# Patient Record
Sex: Male | Born: 1970 | Race: White | Hispanic: No | Marital: Married | Smoking: Never smoker
Health system: Southern US, Community
[De-identification: ages and names within clinical notes are randomized; demographics above are authoritative.]

## PROBLEM LIST (undated history)

## (undated) DIAGNOSIS — Z8249 Family history of ischemic heart disease and other diseases of the circulatory system: Secondary | ICD-10-CM

## (undated) DIAGNOSIS — F419 Anxiety disorder, unspecified: Secondary | ICD-10-CM

## (undated) DIAGNOSIS — I10 Essential (primary) hypertension: Secondary | ICD-10-CM

## (undated) DIAGNOSIS — E785 Hyperlipidemia, unspecified: Secondary | ICD-10-CM

## (undated) DIAGNOSIS — N200 Calculus of kidney: Secondary | ICD-10-CM

## (undated) HISTORY — DX: Calculus of kidney: N20.0

## (undated) HISTORY — DX: Family history of ischemic heart disease and other diseases of the circulatory system: Z82.49

## (undated) HISTORY — DX: Hyperlipidemia, unspecified: E78.5

## (undated) HISTORY — PX: KNEE SURGERY: SHX244

## (undated) HISTORY — DX: Essential (primary) hypertension: I10

## (undated) HISTORY — DX: Anxiety disorder, unspecified: F41.9

---

## 2000-12-06 ENCOUNTER — Encounter: Payer: Self-pay | Admitting: Emergency Medicine

## 2000-12-06 ENCOUNTER — Emergency Department (HOSPITAL_COMMUNITY): Admission: EM | Admit: 2000-12-06 | Discharge: 2000-12-06 | Payer: Self-pay | Admitting: Emergency Medicine

## 2000-12-07 ENCOUNTER — Emergency Department (HOSPITAL_COMMUNITY): Admission: EM | Admit: 2000-12-07 | Discharge: 2000-12-08 | Payer: Self-pay | Admitting: Emergency Medicine

## 2000-12-09 ENCOUNTER — Ambulatory Visit (HOSPITAL_COMMUNITY): Admission: RE | Admit: 2000-12-09 | Discharge: 2000-12-09 | Payer: Self-pay | Admitting: Urology

## 2000-12-09 ENCOUNTER — Encounter: Payer: Self-pay | Admitting: Urology

## 2001-12-16 ENCOUNTER — Emergency Department (HOSPITAL_COMMUNITY): Admission: EM | Admit: 2001-12-16 | Discharge: 2001-12-16 | Payer: Self-pay | Admitting: Emergency Medicine

## 2001-12-16 ENCOUNTER — Encounter: Payer: Self-pay | Admitting: Emergency Medicine

## 2001-12-18 ENCOUNTER — Encounter: Payer: Self-pay | Admitting: Urology

## 2001-12-18 ENCOUNTER — Ambulatory Visit (HOSPITAL_BASED_OUTPATIENT_CLINIC_OR_DEPARTMENT_OTHER): Admission: RE | Admit: 2001-12-18 | Discharge: 2001-12-18 | Payer: Self-pay | Admitting: Urology

## 2002-03-28 ENCOUNTER — Emergency Department (HOSPITAL_COMMUNITY): Admission: EM | Admit: 2002-03-28 | Discharge: 2002-03-28 | Payer: Self-pay | Admitting: Emergency Medicine

## 2002-03-28 ENCOUNTER — Encounter: Payer: Self-pay | Admitting: Emergency Medicine

## 2003-01-16 ENCOUNTER — Emergency Department (HOSPITAL_COMMUNITY): Admission: EM | Admit: 2003-01-16 | Discharge: 2003-01-16 | Payer: Self-pay | Admitting: Emergency Medicine

## 2003-01-16 ENCOUNTER — Encounter: Payer: Self-pay | Admitting: Emergency Medicine

## 2003-08-12 ENCOUNTER — Emergency Department (HOSPITAL_COMMUNITY): Admission: EM | Admit: 2003-08-12 | Discharge: 2003-08-12 | Payer: Self-pay | Admitting: Emergency Medicine

## 2004-12-12 ENCOUNTER — Encounter: Admission: RE | Admit: 2004-12-12 | Discharge: 2004-12-12 | Payer: Self-pay | Admitting: Gastroenterology

## 2004-12-27 ENCOUNTER — Encounter: Admission: RE | Admit: 2004-12-27 | Discharge: 2004-12-27 | Payer: Self-pay | Admitting: Gastroenterology

## 2005-01-25 ENCOUNTER — Ambulatory Visit (HOSPITAL_COMMUNITY): Admission: RE | Admit: 2005-01-25 | Discharge: 2005-01-25 | Payer: Self-pay | Admitting: Gastroenterology

## 2005-01-25 ENCOUNTER — Encounter (INDEPENDENT_AMBULATORY_CARE_PROVIDER_SITE_OTHER): Payer: Self-pay | Admitting: *Deleted

## 2009-04-05 HISTORY — PX: DOPPLER ECHOCARDIOGRAPHY: SHX263

## 2009-04-05 HISTORY — PX: TRANSTHORACIC ECHOCARDIOGRAM: SHX275

## 2010-07-24 HISTORY — PX: NM MYOVIEW LTD: HXRAD82

## 2010-11-10 ENCOUNTER — Encounter
Admission: RE | Admit: 2010-11-10 | Discharge: 2010-11-10 | Payer: Self-pay | Source: Home / Self Care | Attending: Gastroenterology | Admitting: Gastroenterology

## 2010-12-04 ENCOUNTER — Other Ambulatory Visit: Payer: Self-pay | Admitting: Gastroenterology

## 2011-03-23 NOTE — Op Note (Signed)
Richard Morton, Richard Morton              ACCOUNT NO.:  [id number]   MEDICAL RECORD NO.:  [id number]          PATIENT TYPE:  AMB   LOCATION:  ENDO                         FACILITY:  [facility]   PHYSICIAN:  Petra Kuba, M.D.    DATE OF BIRTH:  [birth year]   DATE OF PROCEDURE:  01/25/2005  DATE OF DISCHARGE:                                 OPERATIVE REPORT   PROCEDURE:  Colonoscopy.   INDICATIONS FOR PROCEDURE:  Patient with abdominal pain, increasing  constipation, nondiagnostic workup so far. Want to continue workup with a  colonoscopy.   Consent was signed after risks, benefits, methods, and options were  thoroughly discussed in the office.   MEDICINES USED:  Demerol 100, Versed 9.   DESCRIPTION OF PROCEDURE:  Rectal inspection is pertinent for external  hemorrhoids, small. Digital exam was negative. The video pediatric  adjustable colonoscope was inserted and easily advanced around the colon to  the cecum. This required minimal abdominal pressure but no position changes.  No abnormalities were seen on insertion. The scope was inserted a short ways  into the terminal ileum which was normal. Photo documentation was obtained.  The scope was slowly withdrawn. The prep was adequate. There was some liquid  stool that required washing and suctioning. On slow withdrawal back to the  rectum other than a tiny questionable mid descending polyp which was cold  biopsied x2, no abnormalities were seen. Once back in the rectum, anal  rectal pullthrough and retroflexion confirmed some small hemorrhoids. The  scope was straightened and readvanced a short ways up the left side of the  colon, air was suctioned, scope removed. The patient tolerated the procedure  well. There was no obvious or immediate complication.   ENDOSCOPIC DIAGNOSIS:  1.  Internal and external small hemorrhoids.  2.  Questionable tiny descending polyp cold biopsied.  3.  Otherwise within normal limits to the terminal ileum.   PLAN:  Will await pathology but baring this being an adenoma, repeat  screening probably at age 89, otherwise, will continue Zelnorm since it  seems to be helping. Consider MiraLax next and followup p.r.n. or in 2-3  months.      MEM/MEDQ  D:  01/25/2005  T:  01/25/2005  Job:  [id number]   cc:   Dellis Anes. Idell Pickles, M.D.  [address]  [place]  [place] [address]  Fax: [phone]

## 2011-06-01 ENCOUNTER — Emergency Department (HOSPITAL_COMMUNITY): Payer: BC Managed Care – PPO

## 2011-06-01 ENCOUNTER — Emergency Department (HOSPITAL_COMMUNITY)
Admission: EM | Admit: 2011-06-01 | Discharge: 2011-06-01 | Disposition: A | Discharge status: Home / Self Care | Payer: BC Managed Care – PPO | Attending: Emergency Medicine | Admitting: Emergency Medicine

## 2011-06-01 DIAGNOSIS — S0100XA Unspecified open wound of scalp, initial encounter: Secondary | ICD-10-CM | POA: Insufficient documentation

## 2011-06-01 DIAGNOSIS — S0990XA Unspecified injury of head, initial encounter: Secondary | ICD-10-CM | POA: Insufficient documentation

## 2011-06-01 DIAGNOSIS — Z79899 Other long term (current) drug therapy: Secondary | ICD-10-CM | POA: Insufficient documentation

## 2011-06-01 DIAGNOSIS — R110 Nausea: Secondary | ICD-10-CM | POA: Insufficient documentation

## 2011-06-01 DIAGNOSIS — R296 Repeated falls: Secondary | ICD-10-CM | POA: Insufficient documentation

## 2011-06-01 DIAGNOSIS — R51 Headache: Secondary | ICD-10-CM | POA: Insufficient documentation

## 2011-06-01 DIAGNOSIS — E785 Hyperlipidemia, unspecified: Secondary | ICD-10-CM | POA: Insufficient documentation

## 2011-06-01 DIAGNOSIS — Y92009 Unspecified place in unspecified non-institutional (private) residence as the place of occurrence of the external cause: Secondary | ICD-10-CM | POA: Insufficient documentation

## 2011-06-01 DIAGNOSIS — I10 Essential (primary) hypertension: Secondary | ICD-10-CM | POA: Insufficient documentation

## 2011-06-01 DIAGNOSIS — E876 Hypokalemia: Secondary | ICD-10-CM | POA: Insufficient documentation

## 2011-06-01 DIAGNOSIS — M25569 Pain in unspecified knee: Secondary | ICD-10-CM | POA: Insufficient documentation

## 2011-06-01 DIAGNOSIS — R404 Transient alteration of awareness: Secondary | ICD-10-CM | POA: Insufficient documentation

## 2011-06-01 DIAGNOSIS — R55 Syncope and collapse: Secondary | ICD-10-CM | POA: Insufficient documentation

## 2011-06-01 LAB — DIFFERENTIAL
Eosinophils Absolute: 0.2 10*3/uL (ref 0.0–0.7)
Eosinophils Relative: 2 % (ref 0–5)
Lymphs Abs: 2.1 10*3/uL (ref 0.7–4.0)
Monocytes Relative: 8 % (ref 3–12)

## 2011-06-01 LAB — CBC
MCH: 30.9 pg (ref 26.0–34.0)
MCV: 87.3 fL (ref 78.0–100.0)
Platelets: 181 10*3/uL (ref 150–400)
RBC: 4.95 MIL/uL (ref 4.22–5.81)
RDW: 12.3 % (ref 11.5–15.5)

## 2011-06-01 LAB — BASIC METABOLIC PANEL
CO2: 29 mEq/L (ref 19–32)
Calcium: 9.4 mg/dL (ref 8.4–10.5)
Creatinine, Ser: 0.97 mg/dL (ref 0.50–1.35)

## 2012-04-08 ENCOUNTER — Other Ambulatory Visit: Payer: Self-pay | Admitting: Physician Assistant

## 2012-04-09 NOTE — Telephone Encounter (Signed)
Needs office visit before out 

## 2012-05-12 ENCOUNTER — Other Ambulatory Visit: Payer: Self-pay | Admitting: Physician Assistant

## 2012-05-15 ENCOUNTER — Other Ambulatory Visit: Payer: Self-pay | Admitting: Physician Assistant

## 2012-06-21 ENCOUNTER — Other Ambulatory Visit: Payer: Self-pay | Admitting: Physician Assistant

## 2012-06-21 NOTE — Telephone Encounter (Signed)
When was last OV?

## 2012-06-23 NOTE — Telephone Encounter (Signed)
Never seen here per Epic and Medman.   Deny.Marland KitchenMarland Kitchen

## 2013-07-05 ENCOUNTER — Encounter (HOSPITAL_BASED_OUTPATIENT_CLINIC_OR_DEPARTMENT_OTHER): Payer: Self-pay | Admitting: *Deleted

## 2013-07-05 ENCOUNTER — Emergency Department (HOSPITAL_BASED_OUTPATIENT_CLINIC_OR_DEPARTMENT_OTHER): Payer: Managed Care, Other (non HMO)

## 2013-07-05 ENCOUNTER — Emergency Department (HOSPITAL_BASED_OUTPATIENT_CLINIC_OR_DEPARTMENT_OTHER)
Admission: EM | Admit: 2013-07-05 | Discharge: 2013-07-05 | Disposition: A | Discharge status: Home / Self Care | Payer: Managed Care, Other (non HMO) | Attending: Emergency Medicine | Admitting: Emergency Medicine

## 2013-07-05 DIAGNOSIS — H538 Other visual disturbances: Secondary | ICD-10-CM | POA: Insufficient documentation

## 2013-07-05 DIAGNOSIS — S060X0A Concussion without loss of consciousness, initial encounter: Secondary | ICD-10-CM | POA: Insufficient documentation

## 2013-07-05 DIAGNOSIS — S40812A Abrasion of left upper arm, initial encounter: Secondary | ICD-10-CM

## 2013-07-05 DIAGNOSIS — Z79899 Other long term (current) drug therapy: Secondary | ICD-10-CM | POA: Insufficient documentation

## 2013-07-05 DIAGNOSIS — Y9389 Activity, other specified: Secondary | ICD-10-CM | POA: Insufficient documentation

## 2013-07-05 DIAGNOSIS — IMO0002 Reserved for concepts with insufficient information to code with codable children: Secondary | ICD-10-CM | POA: Insufficient documentation

## 2013-07-05 DIAGNOSIS — Y9289 Other specified places as the place of occurrence of the external cause: Secondary | ICD-10-CM | POA: Insufficient documentation
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
## 2013-07-05 NOTE — ED Notes (Signed)
Karen Sofia, PA at bedside. 
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
## 2013-07-06 NOTE — ED Provider Notes (Signed)
Medical screening examination/treatment/procedure(s) were performed by non-physician practitioner and as supervising physician I was immediately available for consultation/collaboration.   Reneisha Stilley Joseph Blaize Nipper, MD 07/06/13 0658 

## 2013-09-27 ENCOUNTER — Other Ambulatory Visit: Payer: Self-pay | Admitting: Cardiology

## 2013-09-28 NOTE — Telephone Encounter (Signed)
Rx was sent to pharmacy electronically. 

## 2014-04-07 ENCOUNTER — Other Ambulatory Visit: Payer: Self-pay | Admitting: Cardiology

## 2014-04-12 ENCOUNTER — Telehealth: Payer: Self-pay | Admitting: Cardiology

## 2014-04-12 NOTE — Telephone Encounter (Signed)
E-scrbed medication

## 2014-04-12 NOTE — Telephone Encounter (Signed)
Been trying to get generic for CADUET 10/10 mg  Needs to be called to [facility] said they had been trying to get approval for last week.  Please call

## 2014-04-12 NOTE — Telephone Encounter (Signed)
Left message to patient. Medication e- scribed. Also,left message patient need annual appointment.

## 2014-06-18 ENCOUNTER — Encounter: Payer: Self-pay | Admitting: *Deleted

## 2014-06-21 ENCOUNTER — Encounter: Payer: Self-pay | Admitting: Cardiology

## 2014-06-21 ENCOUNTER — Ambulatory Visit (INDEPENDENT_AMBULATORY_CARE_PROVIDER_SITE_OTHER): Payer: Managed Care, Other (non HMO) | Admitting: Cardiology

## 2014-06-21 VITALS — BP 124/74 | HR 74 | Ht 72.0 in | Wt 184.7 lb

## 2014-06-21 DIAGNOSIS — R0789 Other chest pain: Secondary | ICD-10-CM

## 2014-06-21 DIAGNOSIS — I10 Essential (primary) hypertension: Secondary | ICD-10-CM

## 2014-06-21 DIAGNOSIS — Z8249 Family history of ischemic heart disease and other diseases of the circulatory system: Secondary | ICD-10-CM

## 2014-06-21 DIAGNOSIS — E785 Hyperlipidemia, unspecified: Secondary | ICD-10-CM

## 2014-06-21 DIAGNOSIS — R071 Chest pain on breathing: Secondary | ICD-10-CM

## 2014-06-21 NOTE — Patient Instructions (Signed)
No change in medication  Your physician wants you to follow-up in 12 month DR Richard Morton.  You will receive a reminder letter in the mail two months in advance. If you don't receive a letter, please call our office to schedule the follow-up appointment.

## 2014-06-21 NOTE — Progress Notes (Signed)
PATIENT: Richard Morton MRN: [id number] DOB: May 05, 1971 PCP: Windy Carina, PA-C  Clinic Note: Chief Complaint  Patient presents with  . Chest Pain    WENT TO PCP, ACTIVITY AND REST - BILATERAL, COULD REACT PAIN - DEEP BREATHE IN, NO SOB , NO EDEMA    HPI: Richard Morton is a 43 y.o. male with a PMH below who presents today for evaluation of chest discomfort. He is a long-term patient of this practice who I saw last in April 2014. He is a significant family history of CAD and he himself has hypertension and dyslipidemia. He was started on Caduet plus fish oil. He was evaluated for coronary disease with Myoview stress was in 2008 and 2011 that did not show any evidence of ischemia. He is very active with riding his bike and doing gym exercises. He does have significant amount of social stresses with anxiety.  Interval History: He presents today in referral from his PCP after coming in with chest discomfort that really radiated from the center of his chest out along the lower rib cage bilaterally. He does note that he had been doing a lot of biking and yard work with we biking the day before. He noted that the discomfort was worse with deep inspiration and twisting from side to side. Not SOB worse with activity and exertion. It lasted pretty much all day for 2 days with no real relief. Basically had 2 days of symptoms 2 weeks ago, and has not had any further symptoms. He is back to doing his bicycling and was able to ride 20 miles yesterday without any chest discomfort or dyspnea. He does feel sore today however. Besides having his intermittent anxiety attacks for which he takes when necessary Xanax. He really denies any other significant symptoms.  No shortness of breath with rest or exertion. No PND, orthopnea or edema. No palpitations, lightheadedness, dizziness, weakness or syncope/near syncope. No TIA/amaurosis fugax symptoms. No melena, hematochezia, hematuria, or epstaxis. No  claudication.  Past Medical History  Diagnosis Date  . Essential hypertension   . Dyslipidemia, goal LDL below 130   . Family history of premature coronary heart disease     Paternal grandfather and father with MI/CAD in 90s (grandfather)/50s (father)  . Anxiety disorder     Uses when necessary Xanax    Prior Cardiac Evaluation and Past Surgical History: Past Surgical History  Procedure Laterality Date  . Knee surgery      Righ  . Doppler echocardiography  June 2010    normal EF 55% ,WITH NORMAL LV AND FUNCTION,MILDBOWING OF THE ANTERIOR MITRAL LEAFLET ,BUT NO SIGNIFICANT PROLAPSE  . Nm myoview ltd  SEPT 19,2011    WALKED 9 1/2 MINUTES, REACHING 12 METS  WITH SLIGHTLY HYPERTENSIVE RESPONSE. NO ISCHEMIA OR INFARCTION    Allergies  Allergen Reactions  . Hydrocodone   . Hydrocodone-Acetaminophen Rash    Current Outpatient Prescriptions  Medication Sig Dispense Refill  . ALPRAZolam (XANAX) 1 MG tablet take 1 tablet by mouth twice a day if needed      . amlodipine-atorvastatin (CADUET) 10-10 MG per tablet take 1 tablet by mouth at bedtime  30 tablet  3  . finasteride (PROPECIA) 1 MG tablet take 1 tablet by mouth once daily (NEED OFFICE VISIT FOR MORE REFILLS)  30 tablet  0  . indapamide (LOZOL) 2.5 MG tablet Take 2.5 mg by mouth daily.       No current facility-administered medications for this visit.    History  Social History Narrative   Married father of one. Enjoys riding his mountain bike almost 3 days a week. We'll right up to 20 miles at a time. He also does light weights in gym exercises.   Does not smoke tobacco or drink alcohol.    Family History: Heart attack in his father 60(50s) and paternal grandfather 39(48); Heart disease in his father - status post CABG in 2000; Hyperlipidemia in his father and mother.  ROS: A comprehensive Review of Systems - was performed Review of Systems  Constitutional: Negative for fever and chills.  Respiratory: Negative for cough,  hemoptysis, sputum production, shortness of breath and wheezing.   Cardiovascular: Negative.        As noted in history of present illness  Musculoskeletal:       Chest wall soreness along with leg soreness after long bike ride  Psychiatric/Behavioral: Negative for depression, suicidal ideas and substance abuse. The patient is nervous/anxious.   All other systems reviewed and are negative.  PHYSICAL EXAM BP 124/74  Pulse 74  Ht 6' (1.829 m)  Wt 184 lb 11.2 oz (83.779 kg)  BMI 25.04 kg/m2 General appearance: alert, cooperative, appears stated age, no distress and Healthy-appearing HEENT: [place]/AT, EOMI, MMM, anicteric sclera Neck: no adenopathy, no carotid bruit, no JVD, supple, symmetrical, trachea midline and thyroid not enlarged, symmetric, no tenderness/mass/nodules Lungs: clear to auscultation bilaterally, normal percussion bilaterally and Nonlabored, good air movement Heart: regular rate and rhythm, S1, S2 normal, no murmur, click, rub or gallop and normal apical impulse Abdomen: soft, non-tender; bowel sounds normal; no masses,  no organomegaly Extremities: extremities normal, atraumatic, no cyanosis or edema Pulses: 2+ and symmetric Neurologic: Alert and oriented X 3, normal strength and tone. Normal symmetric reflexes. Normal coordination and gait   Adult ECG Report  Rate: 74 ;  Rhythm: normal sinus rhythm; normal axis, intervals, durations and voltage. No ischemic changes noted   Narrative Interpretation: Normal EKG  Recent Labs: None  ASSESSMENT / PLAN: Otherwise relatively stable 43 year old gentleman with symptoms consistent with non-cardiac chest pain  Anterior chest wall pain - noncardiac PA 2 days of chest pain after her lots of physical activity involving the abuse of his arms and shoulders and pectoral muscles. The nature of the symptoms being persistent for 2 days, not made worse with exertion but made worse with inspiration and twisting and turning makes this most  consistent with musculoskeletal chest wall pain that is probably related to muscle strain both from his weed whacking and mountain biking with lots of reverberation from bumpy bike trails.  He is normally good he has had negative Myoview in the past. Symptoms are clearly not anginal in nature. I spent the majority of the visit reassuring him that his symptoms were clearly not anginal based on description. The back and he was able to ride a long bike ride yesterday certainly exclude existing obstructive CAD that would cause resting chest pain 2 weeks ago.  Essential hypertension Excellent control on Caduet  Dyslipidemia, goal LDL below 130 Monitored by PCP. On Caduet which has 40 mg atorvastatin  Family history of premature coronary heart disease He has been evaluated with mild use in the past. All these were negative. I don't think that evaluating his current symptoms with the stress test is necessary especially because he did 18-20 mile bike ride yesterday without any symptoms. Simply based on his risk factors he is being treated aggressively for his hypertension and dyslipidemia. Would not recheck a stress test in  the absence of true cardiac symptoms   Orders Placed This Encounter  Procedures  . EKG 12-Lead   Meds ordered this encounter  Medications  . ALPRAZolam (XANAX) 1 MG tablet    Sig: take 1 tablet by mouth twice a day if needed    Followup: 12 months  Mykala Mccready W, M.D., M.S. Interventional Cardiologist   Pager # [phone] 06/23/2014

## 2014-06-23 ENCOUNTER — Encounter: Payer: Self-pay | Admitting: Cardiology

## 2014-06-23 DIAGNOSIS — Z8249 Family history of ischemic heart disease and other diseases of the circulatory system: Secondary | ICD-10-CM | POA: Insufficient documentation

## 2014-06-23 DIAGNOSIS — R0789 Other chest pain: Secondary | ICD-10-CM | POA: Insufficient documentation

## 2014-06-23 DIAGNOSIS — E785 Hyperlipidemia, unspecified: Secondary | ICD-10-CM | POA: Insufficient documentation

## 2014-06-23 DIAGNOSIS — I10 Essential (primary) hypertension: Secondary | ICD-10-CM | POA: Insufficient documentation

## 2014-06-23 NOTE — Assessment & Plan Note (Addendum)
PA 2 days of chest pain after her lots of physical activity involving the abuse of his arms and shoulders and pectoral muscles. The nature of the symptoms being persistent for 2 days, not made worse with exertion but made worse with inspiration and twisting and turning makes this most consistent with musculoskeletal chest wall pain that is probably related to muscle strain both from his weed whacking and mountain biking with lots of reverberation from bumpy bike trails.  He is normally good he has had negative Myoview in the past. Symptoms are clearly not anginal in nature. I spent the majority of the visit reassuring him that his symptoms were clearly not anginal based on description. The back and he was able to ride a long bike ride yesterday certainly exclude existing obstructive CAD that would cause resting chest pain 2 weeks ago.

## 2014-06-23 NOTE — Assessment & Plan Note (Signed)
Monitored by PCP. On Caduet which has 40 mg atorvastatin

## 2014-06-23 NOTE — Assessment & Plan Note (Signed)
He has been evaluated with mild use in the past. All these were negative. I don't think that evaluating his current symptoms with the stress test is necessary especially because he did 18-20 mile bike ride yesterday without any symptoms. Simply based on his risk factors he is being treated aggressively for his hypertension and dyslipidemia. Would not recheck a stress test in the absence of true cardiac symptoms

## 2014-06-23 NOTE — Assessment & Plan Note (Signed)
Excellent control on Caduet

## 2014-08-12 ENCOUNTER — Other Ambulatory Visit: Payer: Self-pay | Admitting: Cardiology

## 2014-08-12 NOTE — Telephone Encounter (Signed)
Rx was sent to pharmacy electronically. 

## 2014-09-10 ENCOUNTER — Encounter: Payer: Self-pay | Admitting: *Deleted

## 2014-09-10 ENCOUNTER — Other Ambulatory Visit: Payer: Self-pay | Admitting: Nurse Practitioner

## 2014-09-10 ENCOUNTER — Encounter: Payer: Self-pay | Admitting: Nurse Practitioner

## 2014-09-10 ENCOUNTER — Telehealth: Payer: Self-pay | Admitting: Cardiology

## 2014-09-10 ENCOUNTER — Ambulatory Visit (INDEPENDENT_AMBULATORY_CARE_PROVIDER_SITE_OTHER): Payer: Managed Care, Other (non HMO) | Admitting: Nurse Practitioner

## 2014-09-10 ENCOUNTER — Encounter (HOSPITAL_COMMUNITY): Payer: Managed Care, Other (non HMO)

## 2014-09-10 ENCOUNTER — Telehealth: Payer: Self-pay | Admitting: Nurse Practitioner

## 2014-09-10 VITALS — BP 100/70 | HR 61 | Ht 72.0 in | Wt 185.2 lb

## 2014-09-10 DIAGNOSIS — R030 Elevated blood-pressure reading, without diagnosis of hypertension: Secondary | ICD-10-CM

## 2014-09-10 DIAGNOSIS — E785 Hyperlipidemia, unspecified: Secondary | ICD-10-CM

## 2014-09-10 DIAGNOSIS — R0989 Other specified symptoms and signs involving the circulatory and respiratory systems: Secondary | ICD-10-CM

## 2014-09-10 DIAGNOSIS — I10 Essential (primary) hypertension: Secondary | ICD-10-CM

## 2014-09-10 DIAGNOSIS — R071 Chest pain on breathing: Secondary | ICD-10-CM

## 2014-09-10 DIAGNOSIS — IMO0001 Reserved for inherently not codable concepts without codable children: Secondary | ICD-10-CM

## 2014-09-10 DIAGNOSIS — R0789 Other chest pain: Secondary | ICD-10-CM

## 2014-09-10 LAB — BASIC METABOLIC PANEL
BUN: 20 mg/dL (ref 6–23)
CO2: 25 mEq/L (ref 19–32)
Calcium: 9.4 mg/dL (ref 8.4–10.5)
Chloride: 100 mEq/L (ref 96–112)
Creatinine, Ser: 1 mg/dL (ref 0.4–1.5)
GFR: 83.08 mL/min (ref >60.00)
Glucose, Bld: 98 mg/dL (ref 70–99)
Potassium: 3.4 mEq/L — ABNORMAL LOW (ref 3.5–5.1)
Sodium: 137 mEq/L (ref 135–145)

## 2014-09-10 LAB — TSH: TSH: 2.28 u[IU]/mL (ref 0.35–4.50)

## 2014-09-10 MED ORDER — POTASSIUM CHLORIDE ER 10 MEQ PO TBCR: 10.0000 meq | EXTENDED_RELEASE_TABLET | Freq: Every day | ORAL | Status: DC

## 2014-09-10 NOTE — Patient Instructions (Addendum)
We will be checking the following labs today BMET (stat) and TSH  Stay on your current medicines  We will arrange [facility] or here  If you feel an increased heart rate again, try to get an EKG done - can call here or go to ER or Urgent Care  Call the [facility] office at [phone] if you have any questions, problems or concerns.

## 2014-09-10 NOTE — Telephone Encounter (Signed)
Richard Morton is calling today because he has had a episode which include him having sweats and cold chills and chest pains and light headed . Heart rate went up 101 and last night it was in the mid 80's . Would lie to see someone today .Marland Kitchen. Please call    Thanks

## 2014-09-10 NOTE — Progress Notes (Signed)
Richard Morton Date of Birth: [birth year] Medical Record #[id number]  History of Present Illness: Richard Morton is seen back today for a work in visit. Seen for Dr. Herbie BaltimoreHarding. He has HTN and HLD - no known CAD. Negative Myoviews from 2008 and 2011 noted in his record. He is on Propecia for his hair. He uses Lozol for kidney stones.   Last seen by Dr. Herbie BaltimoreHarding in August - felt to be doing ok.  Called earlier today - "Patient states he was out of town on Tuesday at a healthcare convention and around 9pm he had cold and hot sweats, tingly in his legs/hands, felt like heart was racing, and some chest pains (not "crushing" pains). He also reports an upset stomach lasting about 2-3 hours. His symptoms lasted all night. He c/o lightheadedness with movement. He reports similar episodes in the past, but they did not persist 12-13 hours, as this episode did. He reports he takes xanax for anxiety and has had anxiety attacks, but they generally only last about 30 minutes. He reports when he got home, his HR was 101-103, which is very high for him and is now around 70s-80s. He states he tried to recreate his chest pain (trying to r/o if this was MSK pain) and could not recreate it. He has a strong family history of coronary disease and is concerned over his symptoms and the duration of them". Thus added to the flex schedule for today.  Comes in here. He is here alone. He notes that he has basically been doing well. Lots of stress with family, job, parents, etc - life issues. He has chronic anxieties - does not sleep - typically takes xanax at night to sleep. This past [place] [facility] [place] - had dinner- went back to his hotel room around 9pm - abrupt onset of elevated HR, sweating, chills, chest pain - lasted for about 30 minutes but he really did not feel good all night - did not have his Xanax that night or the night prior - then had upset stomach - with diarrhea for 2 to 3 hours. No fevers. Almost went  to the ER - took some Nyquil tabs to sleep. Came home the following day - got his xanax refilled and felt better. He  has not been as active as he has previously - due to stress and work issues and a young child. He is better today but very concerned. He has not had this kind of spell in many months. He says he has had low potassium - told to "eat a banana". Not on any supplement.  Current Outpatient Prescriptions  Medication Sig Dispense Refill  . ALPRAZolam (XANAX) 1 MG tablet take 1 tablet by mouth twice a day if needed    . amlodipine-atorvastatin (CADUET) 10-10 MG per tablet take 1 tablet by mouth at bedtime 30 tablet 9  . finasteride (PROPECIA) 1 MG tablet take 1 tablet by mouth once daily (NEED OFFICE VISIT FOR MORE REFILLS) (Patient taking differently: take 1 tablet by mouth once daily) 30 tablet 0  . indapamide (LOZOL) 2.5 MG tablet Take 2.5 mg by mouth daily.     No current facility-administered medications for this visit.    Allergies  Allergen Reactions  . Hydrocodone   . Hydrocodone-Acetaminophen Rash    Past Medical History  Diagnosis Date  . Essential hypertension   . Dyslipidemia, goal LDL below 130   . Family history of premature coronary heart disease  Paternal grandfather and father with MI/CAD in 6640s (grandfather)/50s (father)  . Anxiety disorder     Uses when necessary Xanax    Past Surgical History  Procedure Laterality Date  . Knee surgery      Righ  . Doppler echocardiography  June 2010    normal EF 55% ,WITH NORMAL LV AND FUNCTION,MILDBOWING OF THE ANTERIOR MITRAL LEAFLET ,BUT NO SIGNIFICANT PROLAPSE  . Nm myoview ltd  SEPT 19,2011    WALKED 9 1/2 MINUTES, REACHING 12 METS  WITH SLIGHTLY HYPERTENSIVE RESPONSE. NO ISCHEMIA OR INFARCTION    History  Smoking status  . Never Smoker   Smokeless tobacco  . Not on file    History  Alcohol Use No    Family History  Problem Relation Age of Onset  . Hyperlipidemia Mother   . Heart disease Father       cabg x3 in 2000  . Hyperlipidemia Father   . Heart attack Father   . Heart attack Paternal Grandfather     In his 3840s    Review of Systems: The review of systems is per the HPI.  All other systems were reviewed and are negative.  Physical Exam: BP 100/70 mmHg  Pulse 61  Ht 6' (1.829 m)  Wt 185 lb 3.2 oz (84.006 kg)  BMI 25.11 kg/m2 Patient is very pleasant and in no acute distress. Skin is warm and dry. Color is normal.  HEENT is unremarkable. Normocephalic/atraumatic. PERRL. Sclera are nonicteric. Neck is supple. No masses. No JVD. Lungs are clear. Cardiac exam shows a regular rate and rhythm. Abdomen is soft. Extremities are without edema. Gait and ROM are intact. No gross neurologic deficits noted.  Wt Readings from Last 3 Encounters:  09/10/14 185 lb 3.2 oz (84.006 kg)  06/21/14 184 lb 11.2 oz (83.779 kg)  07/05/13 175 lb (79.379 kg)    LABORATORY DATA/PROCEDURES:  EKG today shows  Lab Results  Component Value Date   WBC 9.1 06/01/2011   HGB 15.3 06/01/2011   HCT 43.2 06/01/2011   PLT 181 06/01/2011   GLUCOSE 122* 06/01/2011   NA 140 06/01/2011   K 2.8* 06/01/2011   CL 102 06/01/2011   CREATININE 0.97 06/01/2011   BUN 18 06/01/2011   CO2 29 06/01/2011    BNP (last 3 results) No results for input(s): PROBNP in the last 8760 hours.   Assessment / Plan: 1. Multitude of somatic complaints - sounds like a panic attack - plus uses lots of caffeine (loves [place]). Do not think a monitor will help "catch" this since it is so infrequent - advised him if recurs to try and get here/ER/urgent care to check EKG - also suggested the app for his phone to use. Will arrange for GXT. Needs BMET and TSH as well today. Further disposition to follow.   2. HTN -  BP ok  3. HLD - on statin  Patient is agreeable to this plan and will call if any problems develop in the interim.   Rosalio MacadamiaLori C. Adhrit Krenz, RN, ANP-C [facility] [address] [place]  [id number] [phone]

## 2014-09-10 NOTE — Telephone Encounter (Signed)
Patient states he was out of town on Tuesday at a healthcare convention and around 9pm he had cold and hot sweats, tingly in his legs/hands, felt like heart was racing, and some chest pains (not "crushing" pains). He also reports an upset stomach lasting about 2-3 hours. His symptoms lasted all night. He c/o lightheadedness with movement. He reports similar episodes in the past, but they did not persist 12-13 hours, as this episode did. He reports he takes xanax for anxiety and has had anxiety attacks, but they generally only last about 30 minutes. He reports when he got home, his HR was 101-103, which is very high for him and is now around 70s-80s. He states he tried to recreate his chest pain (trying to r/o if this was MSK pain) and could not recreate it. He has a strong family history of coronary disease and is concerned over his symptoms and the duration of them.   Patient is scheduled for appointment TODAY @ 1:30pm

## 2014-09-10 NOTE — Telephone Encounter (Signed)
His BMET was called to him with instructions for starting potassium.  BMET in one week.

## 2014-09-13 ENCOUNTER — Other Ambulatory Visit: Payer: Self-pay | Admitting: *Deleted

## 2014-09-13 DIAGNOSIS — E876 Hypokalemia: Secondary | ICD-10-CM

## 2014-09-17 ENCOUNTER — Other Ambulatory Visit: Payer: Self-pay | Admitting: *Deleted

## 2014-09-17 ENCOUNTER — Other Ambulatory Visit (INDEPENDENT_AMBULATORY_CARE_PROVIDER_SITE_OTHER): Payer: Managed Care, Other (non HMO) | Admitting: *Deleted

## 2014-09-17 DIAGNOSIS — E876 Hypokalemia: Secondary | ICD-10-CM

## 2014-09-17 LAB — BASIC METABOLIC PANEL
BUN: 19 mg/dL (ref 6–23)
CO2: 26 mEq/L (ref 19–32)
Calcium: 9.4 mg/dL (ref 8.4–10.5)
Chloride: 103 mEq/L (ref 96–112)
Creatinine, Ser: 1 mg/dL (ref 0.4–1.5)
GFR: 83.07 mL/min (ref >60.00)
Glucose, Bld: 135 mg/dL — ABNORMAL HIGH (ref 70–99)
Potassium: 3.4 mEq/L — ABNORMAL LOW (ref 3.5–5.1)
Sodium: 136 mEq/L (ref 135–145)

## 2014-09-17 MED ORDER — POTASSIUM CHLORIDE ER 20 MEQ PO TBCR: 20.0000 meq | EXTENDED_RELEASE_TABLET | Freq: Every day | ORAL | Status: DC

## 2014-10-04 ENCOUNTER — Other Ambulatory Visit: Payer: Self-pay

## 2014-10-04 ENCOUNTER — Other Ambulatory Visit (INDEPENDENT_AMBULATORY_CARE_PROVIDER_SITE_OTHER): Payer: Managed Care, Other (non HMO)

## 2014-10-04 DIAGNOSIS — E876 Hypokalemia: Secondary | ICD-10-CM

## 2014-10-04 LAB — BASIC METABOLIC PANEL
BUN: 17 mg/dL (ref 6–23)
CO2: 27 mEq/L (ref 19–32)
Calcium: 9.5 mg/dL (ref 8.4–10.5)
Chloride: 101 mEq/L (ref 96–112)
Creatinine, Ser: 1 mg/dL (ref 0.4–1.5)
GFR: 86.9 mL/min (ref >60.00)
Glucose, Bld: 98 mg/dL (ref 70–99)
Potassium: 3.5 mEq/L (ref 3.5–5.1)
Sodium: 136 mEq/L (ref 135–145)

## 2014-10-04 MED ORDER — POTASSIUM CHLORIDE ER 20 MEQ PO TBCR: 20.0000 meq | EXTENDED_RELEASE_TABLET | Freq: Every day | ORAL | Status: DC

## 2014-10-08 ENCOUNTER — Ambulatory Visit (HOSPITAL_COMMUNITY)
Admission: RE | Admit: 2014-10-08 | Discharge: 2014-10-08 | Disposition: A | Discharge status: Home / Self Care | Payer: Managed Care, Other (non HMO) | Source: Ambulatory Visit | Attending: Internal Medicine | Admitting: Internal Medicine

## 2014-10-08 DIAGNOSIS — R079 Chest pain, unspecified: Secondary | ICD-10-CM | POA: Diagnosis present

## 2014-10-08 DIAGNOSIS — IMO0001 Reserved for inherently not codable concepts without codable children: Secondary | ICD-10-CM

## 2014-10-08 DIAGNOSIS — I10 Essential (primary) hypertension: Secondary | ICD-10-CM

## 2014-10-08 DIAGNOSIS — R071 Chest pain on breathing: Secondary | ICD-10-CM

## 2014-10-08 DIAGNOSIS — R0789 Other chest pain: Secondary | ICD-10-CM

## 2014-10-08 DIAGNOSIS — R0989 Other specified symptoms and signs involving the circulatory and respiratory systems: Secondary | ICD-10-CM

## 2014-10-08 DIAGNOSIS — E785 Hyperlipidemia, unspecified: Secondary | ICD-10-CM

## 2014-10-08 NOTE — Procedures (Signed)
Exercise Treadmill Test   Test  Exercise Tolerance Test Ordering MD: Bryan Lemmaavid Harding, MD  Interpreting MD:   Unique Test No: 1 Treadmill:  1  Indication for ETT: chest pain - rule out ischemia  Contraindication to ETT: No   Stress Modality: exercise - treadmill  Cardiac Imaging Performed: non   Protocol: standard Bruce - maximal  Max BP:  157/70  Max MPHR (bpm):  178 85% MPR (bpm): 151  MPHR obtained (bpm): 181 % MPHR obtained:101  Reached 85% MPHR (min:sec): 7:40 Total Exercise Time (min-sec):  12:00  Workload in METS: 13.40 Borg Scale:   Reason ETT Terminated:  fatigue, right knee pain    ST Segment Analysis At Rest: normal ST segments - no evidence of significant ST depression With Exercise: no evidence of significant ST depression  Other Information Arrhythmia:  occasional pvc's Angina during ETT:  absent (0) Quality of ETT:  diagnostic  ETT Interpretation:  normal - no evidence of ischemia by ST analysis  Comments: Occasional PVC's Excellent exercise tolerance Normal bp response to exercise  Chrystie NoseKenneth C. Hilty, MD, [facility] Attending Cardiologist [facility]

## 2015-02-10 ENCOUNTER — Other Ambulatory Visit: Payer: Self-pay

## 2015-02-10 MED ORDER — POTASSIUM CHLORIDE ER 20 MEQ PO TBCR: 20.0000 meq | EXTENDED_RELEASE_TABLET | Freq: Every day | ORAL | Status: DC

## 2015-04-09 ENCOUNTER — Emergency Department (HOSPITAL_BASED_OUTPATIENT_CLINIC_OR_DEPARTMENT_OTHER)
Admission: EM | Admit: 2015-04-09 | Discharge: 2015-04-09 | Disposition: A | Discharge status: Home / Self Care | Payer: Managed Care, Other (non HMO) | Attending: Emergency Medicine | Admitting: Emergency Medicine

## 2015-04-09 ENCOUNTER — Encounter (HOSPITAL_BASED_OUTPATIENT_CLINIC_OR_DEPARTMENT_OTHER): Payer: Self-pay | Admitting: *Deleted

## 2015-04-09 DIAGNOSIS — Z87442 Personal history of urinary calculi: Secondary | ICD-10-CM | POA: Diagnosis not present

## 2015-04-09 DIAGNOSIS — I10 Essential (primary) hypertension: Secondary | ICD-10-CM | POA: Diagnosis not present

## 2015-04-09 DIAGNOSIS — T63441A Toxic effect of venom of bees, accidental (unintentional), initial encounter: Secondary | ICD-10-CM | POA: Insufficient documentation

## 2015-04-09 DIAGNOSIS — F419 Anxiety disorder, unspecified: Secondary | ICD-10-CM | POA: Diagnosis not present

## 2015-04-09 DIAGNOSIS — E785 Hyperlipidemia, unspecified: Secondary | ICD-10-CM | POA: Diagnosis not present

## 2015-04-09 DIAGNOSIS — Z7952 Long term (current) use of systemic steroids: Secondary | ICD-10-CM | POA: Diagnosis not present

## 2015-04-09 DIAGNOSIS — Z79899 Other long term (current) drug therapy: Secondary | ICD-10-CM | POA: Insufficient documentation

## 2015-04-09 DIAGNOSIS — T63444A Toxic effect of venom of bees, undetermined, initial encounter: Secondary | ICD-10-CM

## 2015-04-09 MED ORDER — PREDNISONE 50 MG PO TABS
60.0000 mg | ORAL_TABLET | Freq: Every day | ORAL | Status: DC
  Administered 2015-04-09: 60 mg via ORAL
  Filled 2015-04-09 (×2): qty 1

## 2015-04-09 MED ORDER — PREDNISONE 50 MG PO TABS: ORAL_TABLET | ORAL | Status: DC

## 2015-04-09 MED ORDER — LORATADINE 10 MG PO TABS: 10.0000 mg | ORAL_TABLET | Freq: Once | ORAL | Status: DC

## 2015-04-09 MED ORDER — FAMOTIDINE 20 MG PO TABS
20.0000 mg | ORAL_TABLET | Freq: Once | ORAL | Status: AC
  Administered 2015-04-09: 20 mg via ORAL
  Filled 2015-04-09: qty 1

## 2015-04-09 MED ORDER — DIPHENHYDRAMINE HCL 25 MG PO CAPS
25.0000 mg | ORAL_CAPSULE | Freq: Once | ORAL | Status: AC
  Administered 2015-04-09: 25 mg via ORAL
  Filled 2015-04-09: qty 1

## 2015-04-09 NOTE — Discharge Instructions (Signed)
1 to 2 tablets of 25 mg Benadryl pills every 4-6 hours as needed to a maximum of 300 mg per day. In addition,  You may take pepcid 20mg   Daily for 5 days and apply a topical hydrocortisone ointment to all affected areas except for the face.   Take acetaminophen (Tylenol) up to 975 mg (this is normally 3 over-the-counter pills) up to 3 times a day. Do not drink alcohol. Make sure your other medications do not contain acetaminophen (Read the labels!)  Do not hesitate to call 911 or return to the emergency room if you develop any shortness of breath, wheezing, tongue or lip swelling.  Please follow with your primary care doctor in the next 2 days for a check-up. They must obtain records for further management.   Do not hesitate to return to the Emergency Department for any new, worsening or concerning symptoms.

## 2015-04-09 NOTE — ED Notes (Signed)
Pt was doing yard work when he was stung 2-3 times.  Pt is unsure if it was a bee or a wasp/yellow jacket.  Pt states that he took tylenol and 25mg  benadryl and he felt almost like something is stuck in his throat.  Pt was advised to come to ER.  No acute distress or sob at this time, no swelling noted in mouth or throat when I looked

## 2015-04-09 NOTE — ED Provider Notes (Signed)
CSN: [id number]     Arrival date & time 04/09/15  1957 History   First MD Initiated Contact with Patient 04/09/15 2105     Chief Complaint  Patient presents with  . Insect Bite     (Consider location/radiation/quality/duration/timing/severity/associated sxs/prior Treatment) HPI  Richard Morton is a 44 y.o. male complaining of being stung 2 times by wasp or yellowjacket several hours ago. Patient took one Benadryl, took a shower and took some acetaminophen. Patient reports a irritation to the back of his throat, he was concerned about possible anaphylactic reaction so he presents to the ED for evaluation. He states that the sensation of irritation has resolved, he denies shortness of breath, wheezing, lip or tong swelling, nausea vomiting dyspepsia. He states his pain is minimal. No prior history of anaphylactic reactions. States his last tetanus shot was within the last 3 years.  Past Medical History  Diagnosis Date  . Essential hypertension   . Dyslipidemia, goal LDL below 130   . Family history of premature coronary heart disease     Paternal grandfather and father with MI/CAD in 50s (grandfather)/50s (father)  . Anxiety disorder     Uses when necessary Xanax  . Kidney stone    Past Surgical History  Procedure Laterality Date  . Knee surgery      Righ  . Doppler echocardiography  June 2010    normal EF 55% ,WITH NORMAL LV AND FUNCTION,MILDBOWING OF THE ANTERIOR MITRAL LEAFLET ,BUT NO SIGNIFICANT PROLAPSE  . Nm myoview ltd  SEPT 19,2011    WALKED 9 1/2 MINUTES, REACHING 12 METS  WITH SLIGHTLY HYPERTENSIVE RESPONSE. NO ISCHEMIA OR INFARCTION   Family History  Problem Relation Age of Onset  . Hyperlipidemia Mother   . Heart disease Father     cabg x3 in 2000  . Hyperlipidemia Father   . Heart attack Father   . Heart attack Paternal Grandfather     In his 48s  . Anemia Neg Hx   . Arrhythmia Neg Hx   . Asthma Neg Hx   . Clotting disorder Neg Hx   . Fainting Neg Hx   . Heart  failure Neg Hx   . Hypertension Neg Hx   . Alzheimer's disease Mother    History  Substance Use Topics  . Smoking status: Never Smoker   . Smokeless tobacco: Not on file  . Alcohol Use: No    Review of Systems  10 systems reviewed and found to be negative, except as noted in the HPI.   Allergies  Hydrocodone and Hydrocodone-acetaminophen  Home Medications   Prior to Admission medications   Medication Sig Start Date End Date Taking? Authorizing Provider  ALPRAZolam Prudy Feeler) 1 MG tablet take 1 tablet by mouth twice a day if needed 05/14/14   Historical Provider, MD  amlodipine-atorvastatin (CADUET) 10-10 MG per tablet take 1 tablet by mouth at bedtime 08/12/14   Marykay Lex, MD  finasteride (PROPECIA) 1 MG tablet take 1 tablet by mouth once daily (NEED OFFICE VISIT FOR MORE REFILLS) Patient taking differently: take 1 tablet by mouth once daily 05/15/12   Raymon Mutton Dunn, PA-C  indapamide (LOZOL) 2.5 MG tablet Take 2.5 mg by mouth daily.    Historical Provider, MD  Potassium Chloride ER 20 MEQ TBCR Take 20 mEq by mouth daily. 02/10/15   Rosalio Macadamia, NP  predniSONE (DELTASONE) 50 MG tablet Take 1 tablet daily with breakfast 04/09/15   Huberta Tompkins, PA-C   BP 128/70 mmHg  Pulse 83  Temp(Src) 98 F (36.7 C) (Oral)  Resp 18  Ht 6' (1.829 m)  Wt 180 lb (81.647 kg)  BMI 24.41 kg/m2  SpO2 100% Physical Exam  Constitutional: He is oriented to person, place, and time. He appears well-developed and well-nourished. No distress.  HENT:  Head: Normocephalic and atraumatic.  Mouth/Throat: Oropharynx is clear and moist.  Eyes: Conjunctivae and EOM are normal. Pupils are equal, round, and reactive to light.  Neck: Normal range of motion.  Cardiovascular: Normal rate, regular rhythm and intact distal pulses.   Pulmonary/Chest: Effort normal and breath sounds normal.  No stridor or drooling. No posterior pharynx edema, lip or tongue swelling. Pt reclining comfortably, speaking in complete  sentences.   No Wheezing, excellent air movement in all fields.     Abdominal: Soft. There is no tenderness.  Musculoskeletal: Normal range of motion.  Neurological: He is alert and oriented to person, place, and time.  Skin: He is not diaphoretic.  Patient has 2 pinpoint lesions to left lower extremity medial side. Trace surrounding erythema.  Psychiatric: He has a normal mood and affect.  Nursing note and vitals reviewed.   ED Course  Procedures (including critical care time) Labs Review Labs Reviewed - No data to display  Imaging Review No results found.   EKG Interpretation None      MDM   Final diagnoses:  Bee sting, undetermined intent, initial encounter    Filed Vitals:   04/09/15 2002 04/09/15 2205  BP: 138/83 128/70  Pulse: 85 83  Temp: 98 F (36.7 C)   TempSrc: Oral   Resp: 20 18  Height: 6' (1.829 m)   Weight: 180 lb (81.647 kg)   SpO2: 100% 100%    Medications  predniSONE (DELTASONE) tablet 60 mg (0 mg Oral Duplicate 04/09/15 2013)  diphenhydrAMINE (BENADRYL) capsule 25 mg (25 mg Oral Given 04/09/15 2011)  famotidine (PEPCID) tablet 20 mg (20 mg Oral Given 04/09/15 2012)    Richard Morton is a pleasant 44 y.o. male presenting with a bee sting and resolve sensation of scratching in the back of his throat. Does not meet criteria for anaphylaxis and epinephrine administration: no multisystem involvement, airway compromise or hypotension. Pt given IVF, decadron, benadryl and pepcid with significant improvement in ED. Discharged with meds for symptom control and referral for allergy testing.   valuation does not show pathology that would require ongoing emergent intervention or inpatient treatment. Pt is hemodynamically stable and mentating appropriately. Discussed findings and plan with patient/guardian, who agrees with care plan. All questions answered. Return precautions discussed and outpatient follow up given.   New Prescriptions   PREDNISONE (DELTASONE)  50 MG TABLET    Take 1 tablet daily with breakfast         Wynetta Emeryicole Saiya Crist, PA-C 04/09/15 2247  Geoffery Lyonsouglas Delo, MD 04/09/15 2251

## 2015-06-10 ENCOUNTER — Other Ambulatory Visit: Payer: Self-pay | Admitting: Cardiology

## 2015-06-10 NOTE — Telephone Encounter (Signed)
Rx(s) sent to pharmacy electronically.  

## 2015-06-17 ENCOUNTER — Other Ambulatory Visit: Payer: Self-pay | Admitting: *Deleted

## 2015-06-17 MED ORDER — POTASSIUM CHLORIDE ER 20 MEQ PO TBCR: 20.0000 meq | EXTENDED_RELEASE_TABLET | Freq: Every day | ORAL | Status: DC

## 2015-06-17 NOTE — Telephone Encounter (Signed)
REFILL 

## 2015-07-01 ENCOUNTER — Emergency Department (HOSPITAL_BASED_OUTPATIENT_CLINIC_OR_DEPARTMENT_OTHER)
Admission: EM | Admit: 2015-07-01 | Discharge: 2015-07-01 | Disposition: A | Discharge status: Home / Self Care | Payer: Managed Care, Other (non HMO) | Attending: Emergency Medicine | Admitting: Emergency Medicine

## 2015-07-01 ENCOUNTER — Encounter (HOSPITAL_BASED_OUTPATIENT_CLINIC_OR_DEPARTMENT_OTHER): Payer: Self-pay

## 2015-07-01 DIAGNOSIS — F419 Anxiety disorder, unspecified: Secondary | ICD-10-CM | POA: Insufficient documentation

## 2015-07-01 DIAGNOSIS — Z87442 Personal history of urinary calculi: Secondary | ICD-10-CM | POA: Insufficient documentation

## 2015-07-01 DIAGNOSIS — I10 Essential (primary) hypertension: Secondary | ICD-10-CM | POA: Diagnosis not present

## 2015-07-01 DIAGNOSIS — E785 Hyperlipidemia, unspecified: Secondary | ICD-10-CM | POA: Diagnosis not present

## 2015-07-01 DIAGNOSIS — Z79899 Other long term (current) drug therapy: Secondary | ICD-10-CM | POA: Insufficient documentation

## 2015-07-01 DIAGNOSIS — R109 Unspecified abdominal pain: Secondary | ICD-10-CM

## 2015-07-01 LAB — CBC WITH DIFFERENTIAL/PLATELET
Basophils Absolute: 0 10*3/uL (ref 0.0–0.1)
Basophils Relative: 1 % (ref 0–1)
EOS ABS: 0.2 10*3/uL (ref 0.0–0.7)
EOS PCT: 4 % (ref 0–5)
HCT: 44.4 % (ref 39.0–52.0)
Hemoglobin: 15.4 g/dL (ref 13.0–17.0)
LYMPHS ABS: 1.8 10*3/uL (ref 0.7–4.0)
LYMPHS PCT: 33 % (ref 12–46)
MCH: 30.4 pg (ref 26.0–34.0)
MCHC: 34.7 g/dL (ref 30.0–36.0)
MCV: 87.6 fL (ref 78.0–100.0)
MONO ABS: 0.6 10*3/uL (ref 0.1–1.0)
MONOS PCT: 11 % (ref 3–12)
Neutro Abs: 2.8 10*3/uL (ref 1.7–7.7)
Neutrophils Relative %: 51 % (ref 43–77)
PLATELETS: 221 10*3/uL (ref 150–400)
RBC: 5.07 MIL/uL (ref 4.22–5.81)
RDW: 12.4 % (ref 11.5–15.5)
WBC: 5.5 10*3/uL (ref 4.0–10.5)

## 2015-07-01 LAB — COMPREHENSIVE METABOLIC PANEL
ALT: 19 U/L (ref 17–63)
ANION GAP: 9 (ref 5–15)
AST: 27 U/L (ref 15–41)
Albumin: 4.3 g/dL (ref 3.5–5.0)
Alkaline Phosphatase: 66 U/L (ref 38–126)
BUN: 18 mg/dL (ref 6–20)
CHLORIDE: 101 mmol/L (ref 101–111)
CO2: 28 mmol/L (ref 22–32)
Calcium: 9.8 mg/dL (ref 8.9–10.3)
Creatinine, Ser: 1.05 mg/dL (ref 0.61–1.24)
Glucose, Bld: 122 mg/dL — ABNORMAL HIGH (ref 65–99)
POTASSIUM: 3.6 mmol/L (ref 3.5–5.1)
SODIUM: 138 mmol/L (ref 135–145)
Total Bilirubin: 1 mg/dL (ref 0.3–1.2)
Total Protein: 7.7 g/dL (ref 6.5–8.1)

## 2015-07-01 LAB — URINALYSIS, ROUTINE W REFLEX MICROSCOPIC
BILIRUBIN URINE: NEGATIVE
Glucose, UA: NEGATIVE mg/dL
HGB URINE DIPSTICK: NEGATIVE
Ketones, ur: NEGATIVE mg/dL
Leukocytes, UA: NEGATIVE
NITRITE: NEGATIVE
PROTEIN: NEGATIVE mg/dL
SPECIFIC GRAVITY, URINE: 1.018 (ref 1.005–1.030)
Urobilinogen, UA: 0.2 mg/dL (ref 0.0–1.0)
pH: 6.5 (ref 5.0–8.0)

## 2015-07-01 NOTE — Discharge Instructions (Signed)
Motrin or Tylenol as needed for pain.  Return to ER with worsening symptoms including fever diarrhea bloody stools or other changes in your condition.   Abdominal Pain Many things can cause abdominal pain. Usually, abdominal pain is not caused by a disease and will improve without treatment. It can often be observed and treated at home. Your health care provider will do a physical exam and possibly order blood tests and X-rays to help determine the seriousness of your pain. However, in many cases, more time must pass before a clear cause of the pain can be found. Before that point, your health care provider may not know if you need more testing or further treatment. HOME CARE INSTRUCTIONS  Monitor your abdominal pain for any changes. The following actions may help to alleviate any discomfort you are experiencing:  Only take over-the-counter or prescription medicines as directed by your health care provider.  Do not take laxatives unless directed to do so by your health care provider.  Try a clear liquid diet (broth, tea, or water) as directed by your health care provider. Slowly move to a bland diet as tolerated. SEEK MEDICAL CARE IF:  You have unexplained abdominal pain.  You have abdominal pain associated with nausea or diarrhea.  You have pain when you urinate or have a bowel movement.  You experience abdominal pain that wakes you in the night.  You have abdominal pain that is worsened or improved by eating food.  You have abdominal pain that is worsened with eating fatty foods.  You have a fever. SEEK IMMEDIATE MEDICAL CARE IF:   Your pain does not go away within 2 hours.  You keep throwing up (vomiting).  Your pain is felt only in portions of the abdomen, such as the right side or the left lower portion of the abdomen.  You pass bloody or black tarry stools. MAKE SURE YOU:  Understand these instructions.   Will watch your condition.   Will get help right away if you  are not doing well or get worse.  Document Released: 08/01/2005 Document Revised: 10/27/2013 Document Reviewed: 07/01/2013 [facility] Patient Information 2015 [place], [place]. This information is not intended to replace advice given to you by your health care provider. Make sure you discuss any questions you have with your health care provider.

## 2015-07-01 NOTE — ED Notes (Signed)
Abdominal pain that started Monday gradually worsening.  States nausea yesterday and loss of appetite.  Took half of an Oxycodone last PM.  Also, states he picked up a heavy trailer on Monday.

## 2015-07-01 NOTE — ED Provider Notes (Signed)
CSN: [id number]     Arrival date & time 07/01/15  0935 History   First MD Initiated Contact with Patient 07/01/15 [phone]     Chief Complaint  Patient presents with  . Abdominal Pain      HPI  Patient presents evaluation of abdominal pain for the last 4 days. He states his pain seems to be worse a few stretches back or moves around. He hasn't had problems with his abdomen normally. He has a good appetite. He states he did some unusual activity and heavy lifting including a heavy trailer hitch on Monday. He states he is leaving on vacation tomorrow and was "just wanted to make sure there wasn't anything like appendicitis".  Past Medical History  Diagnosis Date  . Essential hypertension   . Dyslipidemia, goal LDL below 130   . Family history of premature coronary heart disease     Paternal grandfather and father with MI/CAD in 21s (grandfather)/50s (father)  . Anxiety disorder     Uses when necessary Xanax  . Kidney stone    Past Surgical History  Procedure Laterality Date  . Knee surgery      Righ  . Doppler echocardiography  June 2010    normal EF 55% ,WITH NORMAL LV AND FUNCTION,MILDBOWING OF THE ANTERIOR MITRAL LEAFLET ,BUT NO SIGNIFICANT PROLAPSE  . Nm myoview ltd  SEPT 19,2011    WALKED 9 1/2 MINUTES, REACHING 12 METS  WITH SLIGHTLY HYPERTENSIVE RESPONSE. NO ISCHEMIA OR INFARCTION   Family History  Problem Relation Age of Onset  . Hyperlipidemia Mother   . Heart disease Father     cabg x3 in 2000  . Hyperlipidemia Father   . Heart attack Father   . Heart attack Paternal Grandfather     In his 53s  . Anemia Neg Hx   . Arrhythmia Neg Hx   . Asthma Neg Hx   . Clotting disorder Neg Hx   . Fainting Neg Hx   . Heart failure Neg Hx   . Hypertension Neg Hx   . Alzheimer's disease Mother    Social History  Substance Use Topics  . Smoking status: Never Smoker   . Smokeless tobacco: None  . Alcohol Use: No    Review of Systems  Constitutional: Negative for fever,  chills, diaphoresis, appetite change and fatigue.  HENT: Negative for mouth sores, sore throat and trouble swallowing.   Eyes: Negative for visual disturbance.  Respiratory: Negative for cough, chest tightness, shortness of breath and wheezing.   Cardiovascular: Negative for chest pain.  Gastrointestinal: Positive for abdominal pain. Negative for nausea, vomiting, diarrhea and abdominal distention.  Endocrine: Negative for polydipsia, polyphagia and polyuria.  Genitourinary: Negative for dysuria, frequency and hematuria.  Musculoskeletal: Negative for gait problem.  Skin: Negative for color change, pallor and rash.  Neurological: Negative for dizziness, syncope, light-headedness and headaches.  Hematological: Does not bruise/bleed easily.  Psychiatric/Behavioral: Negative for behavioral problems and confusion.      Allergies  Hydrocodone and Hydrocodone-acetaminophen  Home Medications   Prior to Admission medications   Medication Sig Start Date End Date Taking? Authorizing Provider  ALPRAZolam Prudy Feeler) 1 MG tablet take 1 tablet by mouth twice a day if needed 05/14/14   Historical Provider, MD  amlodipine-atorvastatin (CADUET) 10-10 MG per tablet Take 1 tablet by mouth at bedtime. PATIENT NEEDS TO CONTACT OFFICE FOR ADDITIONAL REFILLS 06/10/15   Marykay Lex, MD  finasteride (PROPECIA) 1 MG tablet take 1 tablet by mouth once daily (NEED OFFICE  VISIT FOR MORE REFILLS) Patient taking differently: take 1 tablet by mouth once daily 05/15/12   Sondra Barges, PA-C  indapamide (LOZOL) 2.5 MG tablet Take 2.5 mg by mouth daily.    Historical Provider, MD  Potassium Chloride ER 20 MEQ TBCR Take 20 mEq by mouth daily. NEED OV. 06/17/15   Marykay Lex, MD   BP 114/63 mmHg  Pulse 75  Temp(Src) 98.5 F (36.9 C) (Oral)  Resp 16  Ht 6' (1.829 m)  Wt 180 lb (81.647 kg)  BMI 24.41 kg/m2  SpO2 100% Physical Exam  Constitutional: He is oriented to person, place, and time. He appears well-developed  and well-nourished. No distress.  HENT:  Head: Normocephalic.  Eyes: Conjunctivae are normal. Pupils are equal, round, and reactive to light. No scleral icterus.  Neck: Normal range of motion. Neck supple. No thyromegaly present.  Cardiovascular: Normal rate and regular rhythm.  Exam reveals no gallop and no friction rub.   No murmur heard. Pulmonary/Chest: Effort normal and breath sounds normal. No respiratory distress. He has no wheezes. He has no rales.  Abdominal: Soft. Bowel sounds are normal. He exhibits no distension. There is no tenderness. There is no rebound.    Musculoskeletal: Normal range of motion.  Neurological: He is alert and oriented to person, place, and time.  Skin: Skin is warm and dry. No rash noted.  Psychiatric: He has a normal mood and affect. His behavior is normal.    ED Course  Procedures (including critical care time) Labs Review Labs Reviewed  COMPREHENSIVE METABOLIC PANEL - Abnormal; Notable for the following:    Glucose, Bld 122 (*)    All other components within normal limits  CBC WITH DIFFERENTIAL/PLATELET  URINALYSIS, ROUTINE W REFLEX MICROSCOPIC (NOT AT [facility])    Imaging Review No results found. I have personally reviewed and evaluated these images and lab results as part of my medical decision-making.   EKG Interpretation None      MDM   Final diagnoses:  Abdominal wall pain    He has a normal urine. Does not have an elevated white blood cell count or fever. His exam is benign and he's had symptoms for 4 days that are reproducible. Comfortable in discharging him without further studies in the emergency room. Given return precautions.    Rolland Porter, MD 07/01/15 [phone]

## 2015-07-01 NOTE — ED Notes (Signed)
MD at bedside. 

## 2015-07-27 ENCOUNTER — Other Ambulatory Visit: Payer: Self-pay | Admitting: Cardiology

## 2015-07-27 MED ORDER — POTASSIUM CHLORIDE ER 20 MEQ PO TBCR: 20.0000 meq | EXTENDED_RELEASE_TABLET | Freq: Every day | ORAL | Status: DC

## 2015-07-27 NOTE — Telephone Encounter (Signed)
Potassium refilled electronically.  Patient notified and also instructed to make an appt with Dr. Herbie Baltimore.  Patient voiced understanding.

## 2015-07-27 NOTE — Telephone Encounter (Signed)
°  1. Which medications need to be refilled? Potassium Chloride MeQ20  2. Which pharmacy is medication to be sent to? [facility] on [address]   3. Do they need a 30 day or 90 day supply? 30  4. Would they like a call back once the medication has been sent to the pharmacy? Yes

## 2015-08-08 ENCOUNTER — Ambulatory Visit (INDEPENDENT_AMBULATORY_CARE_PROVIDER_SITE_OTHER): Payer: Managed Care, Other (non HMO) | Admitting: Cardiology

## 2015-08-08 ENCOUNTER — Encounter: Payer: Self-pay | Admitting: Cardiology

## 2015-08-08 VITALS — BP 104/74 | HR 61 | Ht 72.0 in | Wt 189.0 lb

## 2015-08-08 DIAGNOSIS — I10 Essential (primary) hypertension: Secondary | ICD-10-CM | POA: Diagnosis not present

## 2015-08-08 DIAGNOSIS — R071 Chest pain on breathing: Secondary | ICD-10-CM | POA: Diagnosis not present

## 2015-08-08 DIAGNOSIS — Z8249 Family history of ischemic heart disease and other diseases of the circulatory system: Secondary | ICD-10-CM

## 2015-08-08 DIAGNOSIS — R002 Palpitations: Secondary | ICD-10-CM | POA: Diagnosis not present

## 2015-08-08 DIAGNOSIS — E785 Hyperlipidemia, unspecified: Secondary | ICD-10-CM

## 2015-08-08 DIAGNOSIS — R0789 Other chest pain: Secondary | ICD-10-CM

## 2015-08-08 NOTE — Progress Notes (Signed)
PCP: Aura Dials, MD  Clinic Note: Chief Complaint  Patient presents with  . Follow-up    ANNUAL.Marland KitchenMarland KitchenNO CHEST PAIN/PRESSURE OR SOB  . Palpitations  . Hypertension    HPI: Richard Morton is a 44 y.o. male with a PMH below who presents today for annual followup for palpitations and hypertension.Marland Kitchen  Richard Morton was last seen in Aug 2015. Nov 2015 = rapid HR = saw Richard Morton  Recent Hospitalizations: [facility] - for GI upset & fatigue --> recovered.  Studies Reviewed: no recent studies  Interval History: Richard Morton presents today doing well without any major complaints. He will every now and then feel a sense of fluttering in his chest or closing in sensation consistent with more of an anxiety spell. No really rapid or irregular heartbeats. He spent last weekend hiking and rock climb mountains. Was out there with his family. He notices is a very relaxing at home he experienced for him minimizing his stress and anxiety. His Xanax dose was changed recently and it was a different formulation. The first 4 or 5 at times he took that he felt the fluttering sensation decreased significantly. After that he has not had any further episodes.  No chest pain or shortness of breath with rest or exertion. No PND, orthopnea or edema. No lightheadedness, weakness or syncope/near syncope. No TIA/amaurosis fugax symptom No claudication.   ROS: A comprehensive was performed. Review of Systems  Constitutional: Negative for malaise/fatigue.  Respiratory: Negative for cough and shortness of breath.   Cardiovascular:       Per history of present illness  Gastrointestinal: Negative for blood in stool and melena.       Recent visit to ER for GI upset and fatigue this past weekend  Genitourinary: Negative for hematuria and flank pain.  Musculoskeletal: Positive for joint pain (Right knee is a bit sore.). Negative for myalgias.  Neurological: Positive for dizziness and headaches. Negative for  weakness.  Psychiatric/Behavioral: Positive for depression. The patient is nervous/anxious.        Health issues with parents lots of health issues - anxiety level high.  All other systems reviewed and are negative.    Past Medical History  Diagnosis Date  . Essential hypertension   . Dyslipidemia, goal LDL below 130   . Family history of premature coronary heart disease     Paternal grandfather and father with MI/CAD in 72s (grandfather)/50s (father)  . Anxiety disorder     Uses when necessary Xanax  . Kidney stone     Past Surgical History  Procedure Laterality Date  . Knee surgery      [facility]  . Doppler echocardiography  June 2010    normal EF 55% ,WITH NORMAL LV AND FUNCTION,MILDBOWING OF THE ANTERIOR MITRAL LEAFLET ,BUT NO SIGNIFICANT PROLAPSE  . Nm myoview ltd  SEPT 19,2011    WALKED 9 1/2 MINUTES, REACHING 12 METS  WITH SLIGHTLY HYPERTENSIVE RESPONSE. NO ISCHEMIA OR INFARCTION   Prior to Admission medications   Medication Sig Start Date End Date Taking? Authorizing Provider  alprazolam Prudy Feeler) 2 MG tablet Take 2 mg by mouth at bedtime as needed for sleep or anxiety.   Yes Historical Provider, MD  amlodipine-atorvastatin (CADUET) 10-10 MG per tablet Take 1 tablet by mouth at bedtime. PATIENT NEEDS TO CONTACT OFFICE FOR ADDITIONAL REFILLS 06/10/15  Yes Marykay Lex, MD  finasteride (PROPECIA) 1 MG tablet Take 1 mg by mouth daily.   Yes Historical Provider, MD  indapamide (LOZOL)  2.5 MG tablet Take 2.5 mg by mouth daily.   Yes Historical Provider, MD  Potassium Chloride ER 20 MEQ TBCR Take 20 mEq by mouth daily.   Yes Historical Provider, MD   Allergies  Allergen Reactions  . Hydrocodone-Acetaminophen Nausea Only and Rash    Social History   Social History  . Marital Status: Married    Spouse Name: N/A  . Number of Children: N/A  . Years of Education: N/A   Social History Main Topics  . Smoking status: Never Smoker   . Smokeless tobacco: Never Used  . Alcohol  Use: No  . Drug Use: None  . Sexual Activity: Yes   Other Topics Concern  . None   Social History Narrative   Married father of one. Enjoys riding his mountain bike almost 3 days a week. We'll right up to 20 miles at a time. He also does light weights in gym exercises.   Does not smoke tobacco or drink alcohol.   Family History  Problem Relation Age of Onset  . Hyperlipidemia Mother   . Heart disease Father     cabg x3 in 2000  . Hyperlipidemia Father   . Heart attack Father   . Heart attack Paternal Grandfather     In his 20s  . Anemia Neg Hx   . Arrhythmia Neg Hx   . Asthma Neg Hx   . Clotting disorder Neg Hx   . Fainting Neg Hx   . Heart failure Neg Hx   . Hypertension Neg Hx   . Alzheimer's disease Mother      Wt Readings from Last 3 Encounters:  08/08/15 189 lb (85.73 kg)  07/01/15 180 lb (81.647 kg)  04/09/15 180 lb (81.647 kg)    PHYSICAL EXAM BP 104/74 mmHg  Pulse 61  Ht 6' (1.829 m)  Wt 189 lb (85.73 kg)  BMI 25.63 kg/m2 General appearance: alert, cooperative, appears stated age, no distress and Healthy-appearing HEENT: [place]/AT, EOMI, MMM, anicteric sclera Neck: no adenopathy, no carotid bruit, no JVD, supple, symmetrical, trachea midline and thyroid not enlarged, symmetric, no tenderness/mass/nodules Lungs: clear to auscultation bilaterally, normal percussion bilaterally and Nonlabored, good air movement Heart: regular rate and rhythm, S1, S2 normal, no murmur, click, rub or gallop and normal apical impulse Abdomen: soft, non-tender; bowel sounds normal; no masses, no organomegaly Extremities: extremities normal, atraumatic, no cyanosis or edema; has a knee brace on his right knee -- notes that his knee was sore after hiking this weekend Pulses: 2+ and symmetric Neurologic: Alert and oriented X 3, normal strength and tone. Normal symmetric reflexes. Normal coordination and gait     Adult ECG Report  Rate: 61 ;  Rhythm: normal sinus rhythm and Normal  axis, intervals and durations;   Narrative Interpretation: normal EKG   Other studies Reviewed: Additional studies/ records that were reviewed today include:  Recent Labs:   No results found for: CHOL, HDL, LDLCALC, LDLDIRECT, TRIG, CHOLHDL Lab Results  Component Value Date   K 3.6 07/01/2015    ASSESSMENT / PLAN: Problem List Items Addressed This Visit    Intermittent palpitations (Chronic)    Notably improved. Worsen with anxiety but otherwise relatively stable. Not requiring any medical management.      Relevant Orders   EKG 12-Lead   Family history of premature coronary heart disease (Chronic)   Relevant Orders   EKG 12-Lead   Essential hypertension (Chronic)    Excellent control with current dose of Caduet  Relevant Orders   EKG 12-Lead   Dyslipidemia, goal LDL below 130 (Chronic)    Now down to 10 mg of atorvastatin component of Caduet. Monitored by PCP. No myalgias.      Relevant Orders   EKG 12-Lead   Anterior chest wall pain - noncardiac - Primary    No further having these episodes. He pretty much knows now he has a sensation in his chest done these episodes are associated with anxiety and almost a panic attack. His had negative stress test and is very physically active with no anginal symptoms. The atypical symptoms and he notes are very much not anginal in nature.         Current medicines are reviewed at length with the patient today. (+/- concerns) none The following changes have been made: none Studies Ordered:   Orders Placed This Encounter  Procedures  . EKG 12-Lead   ROV - 1 yr.   Marykay Lex, M.D., M.S. Interventional Cardiologist   Pager # [phone]

## 2015-08-08 NOTE — Patient Instructions (Signed)
No change in current medications   Your physician wants you to follow-up in 12 months with DR HARDING. You will receive a reminder letter in the mail two months in advance. If you don't receive a letter, please call our office to schedule the follow-up appointment.

## 2015-08-10 ENCOUNTER — Encounter: Payer: Self-pay | Admitting: Cardiology

## 2015-08-10 DIAGNOSIS — R002 Palpitations: Secondary | ICD-10-CM | POA: Insufficient documentation

## 2015-08-10 NOTE — Assessment & Plan Note (Signed)
Notably improved. Worsen with anxiety but otherwise relatively stable. Not requiring any medical management.

## 2015-08-10 NOTE — Assessment & Plan Note (Signed)
No further having these episodes. He pretty much knows now he has a sensation in his chest done these episodes are associated with anxiety and almost a panic attack. His had negative stress test and is very physically active with no anginal symptoms. The atypical symptoms and he notes are very much not anginal in nature.

## 2015-08-10 NOTE — Assessment & Plan Note (Signed)
Now down to 10 mg of atorvastatin component of Caduet. Monitored by PCP. No myalgias.

## 2015-08-10 NOTE — Assessment & Plan Note (Signed)
Excellent control with current dose of Caduet

## 2015-09-07 ENCOUNTER — Other Ambulatory Visit: Payer: Self-pay | Admitting: Cardiology

## 2015-09-08 ENCOUNTER — Other Ambulatory Visit: Payer: Self-pay | Admitting: *Deleted

## 2015-09-09 MED ORDER — POTASSIUM CHLORIDE ER 20 MEQ PO TBCR: 20.0000 meq | EXTENDED_RELEASE_TABLET | Freq: Every day | ORAL | Status: DC

## 2015-09-09 NOTE — Telephone Encounter (Signed)
Patient received original RX from Norma FredricksonLori Gerhardt NP ( review lab  Result message) Medication  E-sent to pharmacy

## 2015-09-27 ENCOUNTER — Other Ambulatory Visit: Payer: Self-pay | Admitting: Cardiology

## 2015-09-27 NOTE — Telephone Encounter (Signed)
°*  STAT* If patient is at the pharmacy, call can be transferred to refill team.   1. Which medications need to be refilled? (please list name of each medication and dose if known) Potassium   2. Which pharmacy/location (including street and city if local pharmacy) is medication to be sent to?[facility] [address]  3. Do they need a 30 day or 90 day supply? 30

## 2016-08-13 ENCOUNTER — Other Ambulatory Visit: Payer: Self-pay | Admitting: Cardiology

## 2016-10-04 ENCOUNTER — Other Ambulatory Visit: Payer: Self-pay | Admitting: Cardiology

## 2016-11-05 ENCOUNTER — Other Ambulatory Visit: Payer: Self-pay | Admitting: Cardiology

## 2016-11-06 NOTE — Telephone Encounter (Signed)
Rx request sent to pharmacy.  

## 2016-12-10 ENCOUNTER — Other Ambulatory Visit: Payer: Self-pay | Admitting: Cardiology

## 2017-01-09 ENCOUNTER — Other Ambulatory Visit: Payer: Self-pay | Admitting: *Deleted

## 2017-01-09 MED ORDER — POTASSIUM CHLORIDE ER 20 MEQ PO TBCR: 1.0000 | EXTENDED_RELEASE_TABLET | Freq: Every day | ORAL | 0 refills | Status: DC

## 2017-02-06 ENCOUNTER — Other Ambulatory Visit: Payer: Self-pay | Admitting: Cardiology

## 2017-02-06 ENCOUNTER — Telehealth: Payer: Self-pay | Admitting: Cardiology

## 2017-02-08 ENCOUNTER — Ambulatory Visit (INDEPENDENT_AMBULATORY_CARE_PROVIDER_SITE_OTHER): Payer: Managed Care, Other (non HMO) | Admitting: Cardiology

## 2017-02-08 ENCOUNTER — Encounter: Payer: Self-pay | Admitting: Cardiology

## 2017-02-08 VITALS — BP 116/77 | HR 66 | Ht 72.0 in | Wt 196.0 lb

## 2017-02-08 DIAGNOSIS — Z8249 Family history of ischemic heart disease and other diseases of the circulatory system: Secondary | ICD-10-CM | POA: Diagnosis not present

## 2017-02-08 DIAGNOSIS — Z8639 Personal history of other endocrine, nutritional and metabolic disease: Secondary | ICD-10-CM | POA: Diagnosis not present

## 2017-02-08 DIAGNOSIS — E785 Hyperlipidemia, unspecified: Secondary | ICD-10-CM

## 2017-02-08 DIAGNOSIS — R002 Palpitations: Secondary | ICD-10-CM | POA: Diagnosis not present

## 2017-02-08 DIAGNOSIS — I10 Essential (primary) hypertension: Secondary | ICD-10-CM

## 2017-02-08 MED ORDER — POTASSIUM CHLORIDE ER 20 MEQ PO TBCR: 1.0000 | EXTENDED_RELEASE_TABLET | Freq: Every day | ORAL | 11 refills | Status: DC

## 2017-02-08 NOTE — Patient Instructions (Signed)
Medication Instructions: Your physician recommends that you continue on your current medications as directed. Please refer to the Current Medication list given to you today.   Follow-Up: Your physician wants you to follow-up in: 1 year with Dr. Herbie Baltimore. You will receive a reminder letter in the mail two months in advance. If you don't receive a letter, please call our office to schedule the follow-up appointment.   Any Other Special Instructions will be listed below:  --Stay hydrated (8-10 glasses of water daily)  --Wear tight socks   If you need a refill on your cardiac medications before your next appointment, please call your pharmacy. (Refilled Potassium today)

## 2017-02-08 NOTE — Progress Notes (Signed)
PCP: Aura Dials, MD  Clinic Note: Chief Complaint  Patient presents with  . Follow-up    med. management  pt states no Sx.    HPI: Richard Morton is a 46 y.o. male with a PMH below who presents today for 18 month follow-up for palpitations and hypertension.Marland Kitchen  Richard Morton was last seen on 08/08/2015. Prior to that he was seen in November 2015 by Norma Fredrickson for rapid heart rate   Recent Hospitalizations: None  Studies Reviewed: None  Interval History: Richard Morton returns today doing quite well without any major complaints. He is occasionally had some mild episodes of dizziness, but nothing significant. No really issues with his palpitations as far as rapid heartbeats. This has occasional skipped beats but nothing significant. Blood pressure is doing quite well. He says sometimes it may have some swelling in his feet at the end of the day, but not significant. Usually he notes that his been on in the car for long time or on his feet for long time.  Cardiac Review of Symptoms: No chest pain or shortness of breath with rest or exertion.  No PND, orthopnea with minimal end of day edema.  No weakness or syncope/near syncope,  Or TIA/amaurosis fugax symptoms. No claudication.  ROS: A comprehensive was performed. Review of Systems  HENT: Negative for congestion and nosebleeds.   Respiratory: Negative for shortness of breath.   Gastrointestinal: Negative for blood in stool and melena.  Genitourinary: Negative for hematuria.  Musculoskeletal: Negative for joint pain.  Neurological: Positive for dizziness.  Psychiatric/Behavioral: Negative for depression. The patient is not nervous/anxious.   All other systems reviewed and are negative.   Past Medical History:  Diagnosis Date  . Anxiety disorder    Uses when necessary Xanax  . Dyslipidemia, goal LDL below 130   . Essential hypertension   . Family history of premature coronary heart disease    Paternal grandfather and father  with MI/CAD in 75s (grandfather)/50s (father)  . Kidney stone     Past Surgical History:  Procedure Laterality Date  . DOPPLER ECHOCARDIOGRAPHY  June 2010   normal EF 55% ,WITH NORMAL LV AND FUNCTION,MILDBOWING OF THE ANTERIOR MITRAL LEAFLET ,BUT NO SIGNIFICANT PROLAPSE  . KNEE SURGERY     [facility]  . NM MYOVIEW LTD  SEPT 19,2011   WALKED 9 1/2 MINUTES, REACHING 12 METS  WITH SLIGHTLY HYPERTENSIVE RESPONSE. NO ISCHEMIA OR INFARCTION    Current Meds  Medication Sig  . alprazolam (XANAX) 2 MG tablet Take 2 mg by mouth at bedtime as needed for sleep or anxiety.  Marland Kitchen amlodipine-atorvastatin (CADUET) 10-10 MG tablet take 1 tablet by mouth at bedtime  . finasteride (PROPECIA) 1 MG tablet Take 1 mg by mouth daily.  . indapamide (LOZOL) 2.5 MG tablet Take 2.5 mg by mouth daily.  . Potassium Chloride ER 20 MEQ TBCR Take 1 tablet by mouth daily.  . [DISCONTINUED] Potassium Chloride ER 20 MEQ TBCR Take 1 tablet by mouth daily.    Allergies  Allergen Reactions  . Hydrocodone-Acetaminophen Nausea Only and Rash    Social History   Social History  . Marital status: Married    Spouse name: N/A  . Number of children: N/A  . Years of education: N/A   Social History Main Topics  . Smoking status: Never Smoker  . Smokeless tobacco: Never Used  . Alcohol use No  . Drug use: Unknown  . Sexual activity: Yes   Other Topics Concern  . None  Social History Narrative   Married father of one. Enjoys riding his mountain bike almost 3 days a week. We'll right up to 20 miles at a time. He also does light weights in gym exercises.   Does not smoke tobacco or drink alcohol.    family history includes Alzheimer's disease in his mother; Heart attack in his father and paternal grandfather; Heart disease in his father; Hyperlipidemia in his father and mother.  Wt Readings from Last 3 Encounters:  02/08/17 196 lb (88.9 kg)  08/08/15 189 lb (85.7 kg)  07/01/15 180 lb (81.6 kg)    PHYSICAL EXAM BP  116/77 (BP Location: Left Arm, Patient Position: Sitting, Cuff Size: Normal)   Pulse 66   Ht 6' (1.829 m)   Wt 196 lb (88.9 kg)   BMI 26.58 kg/m  General appearance: Alert and oriented X 3, cooperative, appears stated age, no distress and Healthy-appearing Neck: no adenopathy, no carotid bruit, no JVD, supple,  Lungs: clear to auscultation bilaterally, normal percussion bilaterally and Nonlabored, good air movement Heart: regular rate and rhythm, S1 and S2 normal, no murmur, click, rub or gallop and normal apical impulse Abdomen: soft, non-tender; bowel sounds normal; no masses, no organomegaly Extremities: extremities normal, atraumatic, no cyanosis or edema; has a knee brace on his right knee -- notes that his knee was sore after hiking this weekend Pulses: 2+ and symmetric   Adult ECG Report n/a  Other studies Reviewed: Additional studies/ records that were reviewed today include:  Recent Labs:  No recent labs available     ASSESSMENT / PLAN: Problem List Items Addressed This Visit    Dyslipidemia, goal LDL below 130 (Chronic)    He is on the atorvastatin component of Caduet. Monitored by PCP.      Essential hypertension (Chronic)    Well-controlled on current medications.      Family history of premature coronary heart disease (Chronic)   H/O hypokalemia (Chronic)    He has been on potassium for a while now. We are simply refilling his daily tablet. Labs are followed by PCP.      Intermittent palpitations - Primary (Chronic)    Notably improved. Not currently treating medically.         Current medicines are reviewed at length with the patient today. (+/- concerns) n/a The following changes have been made: n/a  Patient Instructions  Medication Instructions: Your physician recommends that you continue on your current medications as directed. Please refer to the Current Medication list given to you today.   Follow-Up: Your physician wants you to follow-up  in: 1 year with Dr. Herbie Baltimore. You will receive a reminder letter in the mail two months in advance. If you don't receive a letter, please call our office to schedule the follow-up appointment.   Any Other Special Instructions will be listed below:  --Stay hydrated (8-10 glasses of water daily)  --Wear tight socks   If you need a refill on your cardiac medications before your next appointment, please call your pharmacy. (Refilled Potassium today)    Studies Ordered:   No orders of the defined types were placed in this encounter.     Bryan Lemma, M.D., M.S. Interventional Cardiologist   Pager # [phone] Phone # [phone] [address] [place], [place] [address]

## 2017-02-10 ENCOUNTER — Encounter: Payer: Self-pay | Admitting: Cardiology

## 2017-02-10 DIAGNOSIS — Z8639 Personal history of other endocrine, nutritional and metabolic disease: Secondary | ICD-10-CM | POA: Insufficient documentation

## 2017-02-10 NOTE — Assessment & Plan Note (Signed)
Well-controlled on current medications 

## 2017-02-10 NOTE — Assessment & Plan Note (Signed)
He has been on potassium for a while now. We are simply refilling his daily tablet. Labs are followed by PCP.

## 2017-02-10 NOTE — Assessment & Plan Note (Signed)
He is on the atorvastatin component of Caduet. Monitored by PCP.

## 2017-02-10 NOTE — Assessment & Plan Note (Signed)
Notably improved. Not currently treating medically.

## 2017-04-25 NOTE — Telephone Encounter (Signed)
close

## 2017-06-07 ENCOUNTER — Emergency Department (HOSPITAL_BASED_OUTPATIENT_CLINIC_OR_DEPARTMENT_OTHER)
Admission: EM | Admit: 2017-06-07 | Discharge: 2017-06-07 | Disposition: A | Discharge status: Home / Self Care | Payer: 59 | Attending: Emergency Medicine | Admitting: Emergency Medicine

## 2017-06-07 ENCOUNTER — Emergency Department (HOSPITAL_BASED_OUTPATIENT_CLINIC_OR_DEPARTMENT_OTHER): Payer: 59

## 2017-06-07 ENCOUNTER — Encounter (HOSPITAL_BASED_OUTPATIENT_CLINIC_OR_DEPARTMENT_OTHER): Payer: Self-pay

## 2017-06-07 DIAGNOSIS — S0990XA Unspecified injury of head, initial encounter: Secondary | ICD-10-CM

## 2017-06-07 DIAGNOSIS — S060X0A Concussion without loss of consciousness, initial encounter: Secondary | ICD-10-CM | POA: Insufficient documentation

## 2017-06-07 DIAGNOSIS — Y9289 Other specified places as the place of occurrence of the external cause: Secondary | ICD-10-CM | POA: Insufficient documentation

## 2017-06-07 DIAGNOSIS — W228XXA Striking against or struck by other objects, initial encounter: Secondary | ICD-10-CM | POA: Diagnosis not present

## 2017-06-07 DIAGNOSIS — Z79899 Other long term (current) drug therapy: Secondary | ICD-10-CM | POA: Diagnosis not present

## 2017-06-07 DIAGNOSIS — Y9389 Activity, other specified: Secondary | ICD-10-CM | POA: Insufficient documentation

## 2017-06-07 DIAGNOSIS — Y999 Unspecified external cause status: Secondary | ICD-10-CM | POA: Insufficient documentation

## 2017-06-07 DIAGNOSIS — I10 Essential (primary) hypertension: Secondary | ICD-10-CM | POA: Diagnosis not present

## 2017-06-07 MED ORDER — ACETAMINOPHEN 325 MG PO TABS
650.0000 mg | ORAL_TABLET | Freq: Once | ORAL | Status: AC
  Administered 2017-06-07: 650 mg via ORAL
  Filled 2017-06-07: qty 2
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
## 2017-06-07 NOTE — ED Triage Notes (Signed)
Pt states he turned and hit right temporal area on a beam outside his home 2 days ago- c/o HA, visual changes, photosensitivity-no break in skin noted-no LOC-NAD-steady gait

## 2017-06-07 NOTE — Discharge Instructions (Signed)
Please read attached information regarding her condition. Take Tylenol or ibuprofen as needed for headache. Return to ED for worsening pain, increased headache, additional injury, loss of consciousness, numbness, weakness or trouble walking.

## 2017-06-11 ENCOUNTER — Other Ambulatory Visit: Payer: Self-pay | Admitting: Cardiology

## 2017-06-11 NOTE — Telephone Encounter (Signed)
REFILL 

## 2018-01-04 ENCOUNTER — Other Ambulatory Visit: Payer: Self-pay | Admitting: Cardiology

## 2018-01-06 ENCOUNTER — Other Ambulatory Visit: Payer: Self-pay | Admitting: Cardiology

## 2018-01-06 NOTE — Telephone Encounter (Signed)
REFILL 

## 2018-01-16 ENCOUNTER — Emergency Department (HOSPITAL_BASED_OUTPATIENT_CLINIC_OR_DEPARTMENT_OTHER): Payer: 59

## 2018-01-16 ENCOUNTER — Encounter (HOSPITAL_BASED_OUTPATIENT_CLINIC_OR_DEPARTMENT_OTHER): Payer: Self-pay | Admitting: Emergency Medicine

## 2018-01-16 ENCOUNTER — Emergency Department (HOSPITAL_BASED_OUTPATIENT_CLINIC_OR_DEPARTMENT_OTHER)
Admission: EM | Admit: 2018-01-16 | Discharge: 2018-01-16 | Disposition: A | Discharge status: Home / Self Care | Payer: 59 | Attending: Emergency Medicine | Admitting: Emergency Medicine

## 2018-01-16 ENCOUNTER — Other Ambulatory Visit: Payer: Self-pay

## 2018-01-16 DIAGNOSIS — F419 Anxiety disorder, unspecified: Secondary | ICD-10-CM | POA: Insufficient documentation

## 2018-01-16 DIAGNOSIS — R109 Unspecified abdominal pain: Secondary | ICD-10-CM

## 2018-01-16 DIAGNOSIS — I10 Essential (primary) hypertension: Secondary | ICD-10-CM | POA: Insufficient documentation

## 2018-01-16 DIAGNOSIS — R1031 Right lower quadrant pain: Secondary | ICD-10-CM | POA: Insufficient documentation

## 2018-01-16 DIAGNOSIS — Z79899 Other long term (current) drug therapy: Secondary | ICD-10-CM | POA: Insufficient documentation

## 2018-01-16 LAB — CBC
HCT: 47 % (ref 39.0–52.0)
Hemoglobin: 16.4 g/dL (ref 13.0–17.0)
MCH: 31 pg (ref 26.0–34.0)
MCHC: 34.9 g/dL (ref 30.0–36.0)
MCV: 88.8 fL (ref 78.0–100.0)
Platelets: 217 10*3/uL (ref 150–400)
RBC: 5.29 MIL/uL (ref 4.22–5.81)
RDW: 12.1 % (ref 11.5–15.5)
WBC: 5.3 10*3/uL (ref 4.0–10.5)

## 2018-01-16 LAB — URINALYSIS, ROUTINE W REFLEX MICROSCOPIC
Bilirubin Urine: NEGATIVE
GLUCOSE, UA: NEGATIVE mg/dL
Hgb urine dipstick: NEGATIVE
Ketones, ur: NEGATIVE mg/dL
Leukocytes, UA: NEGATIVE
NITRITE: NEGATIVE
Protein, ur: NEGATIVE mg/dL
Specific Gravity, Urine: 1.015 (ref 1.005–1.030)
pH: 8.5 — ABNORMAL HIGH (ref 5.0–8.0)

## 2018-01-16 LAB — COMPREHENSIVE METABOLIC PANEL
ALK PHOS: 79 U/L (ref 38–126)
ALT: 27 U/L (ref 17–63)
ANION GAP: 7 (ref 5–15)
AST: 29 U/L (ref 15–41)
Albumin: 4.6 g/dL (ref 3.5–5.0)
BILIRUBIN TOTAL: 1 mg/dL (ref 0.3–1.2)
BUN: 21 mg/dL — ABNORMAL HIGH (ref 6–20)
CO2: 27 mmol/L (ref 22–32)
CREATININE: 1.1 mg/dL (ref 0.61–1.24)
Calcium: 9.4 mg/dL (ref 8.9–10.3)
Chloride: 101 mmol/L (ref 101–111)
GFR calc non Af Amer: >60 mL/min (ref >60)
Glucose, Bld: 102 mg/dL — ABNORMAL HIGH (ref 65–99)
Potassium: 3.6 mmol/L (ref 3.5–5.1)
SODIUM: 135 mmol/L (ref 135–145)
TOTAL PROTEIN: 8.1 g/dL (ref 6.5–8.1)

## 2018-01-16 LAB — LIPASE, BLOOD: Lipase: 38 U/L (ref 11–51)

## 2018-01-16 MED ORDER — KETOROLAC TROMETHAMINE 30 MG/ML IJ SOLN: 30.0000 mg | Freq: Once | INTRAMUSCULAR | Status: DC

## 2018-01-16 MED ORDER — KETOROLAC TROMETHAMINE 30 MG/ML IJ SOLN
30.0000 mg | Freq: Once | INTRAMUSCULAR | Status: AC
  Administered 2018-01-16: 30 mg via INTRAMUSCULAR
  Filled 2018-01-16: qty 1

## 2018-01-16 NOTE — ED Notes (Signed)
Patient transported to CT 

## 2018-01-16 NOTE — Discharge Instructions (Signed)
Please read attached information regarding your condition. Continue your home medications as previously prescribed. Return to ED for worsening symptoms, severe abdominal pain or chest pain, blood in stool, trouble tolerating p.o. intake.

## 2018-01-16 NOTE — ED Triage Notes (Signed)
LUQ abd pain x 1 weeks, feels bloated. Denies N/V/D

## 2018-01-16 NOTE — ED Provider Notes (Signed)
[facility] EMERGENCY DEPARTMENT Provider Note   CSN: [id number] Arrival date & time: 01/16/18  1426     History   Chief Complaint Chief Complaint  Patient presents with  . Abdominal Pain    HPI Richard Morton is a 47 y.o. male with past medical history of anxiety, hypertension, prior nephrolithiasis, who presents to ED for evaluation of one week history of constant, sharp right sided flank pain radiating to right lower quadrant.  At first he believed it was due to a muscle strain from lifting at the gym.  However, he states that this is not feels like his prior muscle strains.  He has tried simethicone with no relief in his symptoms.  He reports a history of constipation but tells me that his last normal bowel movement was a few hours ago here in the ED.  He denies any nausea or vomiting.  Denies any fevers, chest pain, dysuria, hematuria, hematochezia or melena.  Last kidney stone was approximately 10 years ago and is unsure if this feels similar.  He did need to complete lithotripsy as a result of it.  HPI  Past Medical History:  Diagnosis Date  . Anxiety disorder    Uses when necessary Xanax  . Dyslipidemia, goal LDL below 130   . Essential hypertension   . Family history of premature coronary heart disease    Paternal grandfather and father with MI/CAD in 77s (grandfather)/50s (father)  . Kidney stone     Patient Active Problem List   Diagnosis Date Noted  . H/O hypokalemia 02/10/2017  . Intermittent palpitations 08/10/2015  . Anterior chest wall pain - noncardiac 06/23/2014  . Essential hypertension   . Dyslipidemia, goal LDL below 130   . Family history of premature coronary heart disease     Past Surgical History:  Procedure Laterality Date  . DOPPLER ECHOCARDIOGRAPHY  June 2010   normal EF 55% ,WITH NORMAL LV AND FUNCTION,MILDBOWING OF THE ANTERIOR MITRAL LEAFLET ,BUT NO SIGNIFICANT PROLAPSE  . KNEE SURGERY     [place]igh  . NM MYOVIEW LTD  SEPT 19,2011   WALKED 9 1/2 MINUTES, REACHING 12 METS  WITH SLIGHTLY HYPERTENSIVE RESPONSE. NO ISCHEMIA OR INFARCTION       Home Medications    Prior to Admission medications   Medication Sig Start Date End Date Taking? Authorizing Provider  alprazolam Prudy Feeler) 2 MG tablet Take 2 mg by mouth at bedtime as needed for sleep or anxiety.    [provider]  amlodipine-atorvastatin (CADUET) 10-10 MG tablet TAKE 1 TABLET BY MOUTH AT BEDTIME 06/11/17   Marykay Lex, MD  finasteride (PROPECIA) 1 MG tablet Take 1 mg by mouth daily.    [provider]  indapamide (LOZOL) 2.5 MG tablet Take 2.5 mg by mouth daily.    [provider]  Potassium Chloride ER 20 MEQ TBCR Take 1 tablet by mouth daily. NEED OV. 01/06/18   Marykay Lex, MD    Family History Family History  Problem Relation Age of Onset  . Hyperlipidemia Mother   . Alzheimer's disease Mother   . Heart disease Father        cabg x3 in 2000  . Hyperlipidemia Father   . Heart attack Father   . Heart attack Paternal Grandfather        In his 7s  . Anemia Neg Hx   . Arrhythmia Neg Hx   . Asthma Neg Hx   . Clotting disorder Neg Hx   .  Fainting Neg Hx   . Heart failure Neg Hx   . Hypertension Neg Hx     Social History Social History   Tobacco Use  . Smoking status: Never Smoker  . Smokeless tobacco: Never Used  Substance Use Topics  . Alcohol use: No    Alcohol/week: 0.0 oz  . Drug use: No     Allergies   Hydrocodone-acetaminophen   Review of Systems Review of Systems  Constitutional: Negative for appetite change, chills and fever.  HENT: Negative for ear pain, rhinorrhea, sneezing and sore throat.   Eyes: Negative for photophobia and visual disturbance.  Respiratory: Negative for cough, chest tightness, shortness of breath and wheezing.   Cardiovascular: Negative for chest pain and palpitations.  Gastrointestinal: Positive for abdominal pain. Negative for blood in stool, constipation, diarrhea,  nausea and vomiting.  Genitourinary: Negative for dysuria, hematuria and urgency.  Musculoskeletal: Negative for myalgias.  Skin: Negative for rash.  Neurological: Negative for dizziness, weakness and light-headedness.     Physical Exam Updated Vital Signs BP 109/67 (BP Location: Right Arm)   Pulse 63   Temp 98.3 F (36.8 C) (Oral)   Resp 18   Ht 6' (1.829 m)   Wt 81.6 kg (180 lb)   SpO2 100%   BMI 24.41 kg/m   Physical Exam  Constitutional: He appears well-developed and well-nourished. No distress.  Anxious.  HENT:  Head: Normocephalic and atraumatic.  Nose: Nose normal.  Eyes: Conjunctivae and EOM are normal. Left eye exhibits no discharge. No scleral icterus.  Neck: Normal range of motion. Neck supple.  Cardiovascular: Normal rate, regular rhythm, normal heart sounds and intact distal pulses. Exam reveals no gallop and no friction rub.  No murmur heard. Pulmonary/Chest: Effort normal and breath sounds normal. No respiratory distress.  Abdominal: Soft. Bowel sounds are normal. He exhibits no distension. There is no tenderness. There is no guarding.    Pain reported in the indicated area but no specific tenderness to palpation.  No rebound or guarding present.  CVA tenderness bilaterally.  Musculoskeletal: Normal range of motion. He exhibits no edema.  Neurological: He is alert. He exhibits normal muscle tone. Coordination normal.  Skin: Skin is warm and dry. No rash noted.  Psychiatric: He has a normal mood and affect.  Nursing note and vitals reviewed.    ED Treatments / Results  Labs (all labs ordered are listed, but only abnormal results are displayed) Labs Reviewed  COMPREHENSIVE METABOLIC PANEL - Abnormal; Notable for the following components:      Result Value   Glucose, Bld 102 (*)    BUN 21 (*)    All other components within normal limits  URINALYSIS, ROUTINE W REFLEX MICROSCOPIC - Abnormal; Notable for the following components:   pH 8.5 (*)    All  other components within normal limits  LIPASE, BLOOD  CBC    EKG  EKG Interpretation None       Radiology Ct Renal Stone Study  Result Date: 01/16/2018 CLINICAL DATA:  47 year old male with flank pain radiating to the right lower quadrant. EXAM: CT ABDOMEN AND PELVIS WITHOUT CONTRAST TECHNIQUE: Multidetector CT imaging of the abdomen and pelvis was performed following the standard protocol without IV contrast. COMPARISON:  Abdominal radiograph dated 05/18/2013 and CT dated 11/10/2010 FINDINGS: Evaluation of this exam is limited in the absence of intravenous contrast. Lower chest: The visualized lung bases are clear. No intra-abdominal free air or free fluid. Hepatobiliary: Small calcific focus in the right lobe of  the liver along the diaphragm most likely a granuloma. The liver is otherwise unremarkable. No intrahepatic biliary ductal dilatation. The gallbladder is unremarkable. Pancreas: Unremarkable. No pancreatic ductal dilatation or surrounding inflammatory changes. Spleen: Normal in size without focal abnormality. Adrenals/Urinary Tract: The adrenal glands are unremarkable. Multiple punctate nonobstructing left renal calculi may be present. There is no hydronephrosis. The right kidney is unremarkable. The visualized ureters and urinary bladder are unremarkable. Stomach/Bowel: There is no bowel obstruction or active inflammation. The appendix is normal. Vascular/Lymphatic: The abdominal aorta and IVC are grossly unremarkable on this noncontrast CT. No portal venous gas. There is no adenopathy. Reproductive: The prostate and seminal vesicles are grossly unremarkable. No pelvic mass. Other: Small fat containing umbilical hernia. Musculoskeletal: A 2 cm mixed density lesion in the left iliac bone adjacent to the SI joint is not well characterized but appears nonaggressive and may represent a hemangioma. This is stable compared to the study of 2012. No acute osseous pathology. IMPRESSION: 1. No  hydronephrosis or obstructing stone. 2. No bowel obstruction or active inflammation.  Normal appendix. Electronically Signed   By: Elgie CollardArash  Radparvar M.D.   On: 01/16/2018 18:39    Procedures Procedures (including critical care time)  Medications Ordered in ED Medications  ketorolac (TORADOL) 30 MG/ML injection 30 mg (30 mg Intramuscular Given 01/16/18 1906)     Initial Impression / Assessment and Plan / ED Course  I have reviewed the triage vital signs and the nursing notes.  Pertinent labs & imaging results that were available during my care of the patient were reviewed by me and considered in my medical decision making (see chart for details).     Patient presents to ED for evaluation of one week history of constant, sharp right-sided flank pain radiating to right lower quadrant.  He has had a history of kidney stones but is unable to tell me if this is similar because his last one was a decade ago.  At first he was unsure if this was a muscle strain from lifting at the gym.  No relief with simethicone.  Denies any diarrhea, constipation, nausea, vomiting, hematemesis, hematochezia or melena.  Denies any dysuria or hematuria.  Patient does appear anxious on physical exam.  Pain is reported in the indicated area above but no tenderness to palpation and no rebound, guarding present.  He is afebrile with no use of antipyretics.  He is overall well-appearing does not appear dehydrated.  Work including CBC, BMP, lipase, urinalysis unremarkable.  CT showed no evidence of appendicitis, nephrolithiasis or other acute, life-threatening or surgical cause.  Symptoms could very well be due to a muscle strain is on the fact that his imaging and lab work is reassuring.  Will treat with Toradol, advised him to follow-up with PCP for further evaluation.  Patient appeared anxious at first but appeared reassured after negative workup was told. Patient stable for discharge at this time.  Strict return precautions  given.  Portions of this note were generated with Scientist, clinical (histocompatibility and immunogenetics)Dragon dictation software. Dictation errors may occur despite best attempts at proofreading.   Final Clinical Impressions(s) / ED Diagnoses   Final diagnoses:  Abdominal wall pain    ED Discharge Orders    None       Dietrich PatesKhatri, Damar Petit, PA-C 01/16/18 1914    Tegeler, Canary Brimhristopher J, MD 01/17/18 [phone]

## 2018-05-05 ENCOUNTER — Encounter: Payer: Self-pay | Admitting: Cardiology

## 2018-05-05 ENCOUNTER — Ambulatory Visit (INDEPENDENT_AMBULATORY_CARE_PROVIDER_SITE_OTHER): Payer: 59 | Admitting: Cardiology

## 2018-05-05 VITALS — BP 118/77 | HR 61 | Ht 73.0 in | Wt 187.2 lb

## 2018-05-05 DIAGNOSIS — I10 Essential (primary) hypertension: Secondary | ICD-10-CM

## 2018-05-05 DIAGNOSIS — R002 Palpitations: Secondary | ICD-10-CM

## 2018-05-05 DIAGNOSIS — Z8249 Family history of ischemic heart disease and other diseases of the circulatory system: Secondary | ICD-10-CM

## 2018-05-05 NOTE — Progress Notes (Signed)
PCP: Tracey Harries, MD  Clinic Note: Chief Complaint  Patient presents with  . Follow-up    No complaints  . Palpitations    Stable    HPI: Richard Morton is a 47 y.o. male with a PMH below who presents today for 14 follow-up for palpitations and hypertension.   Sachin Ferencz was last seen in April 2018.  Was doing relatively well at that point.  Palpitations notably improved and blood pressure well controlled.  Recent Hospitalizations: None  Studies Reviewed: None  Interval History: Richard Morton returns today pretty much feeling well.  No major issues.  He said one day he had some increased heartbeats and palpitations, but that did break with vagal maneuvers.  For the most part otherwise has been doing much better.  His anxiety level is also decreased a lot.  He may still notes some skipped heartbeats but no longer having the prolonged rapid palpitations. His blood pressures been well controlled.  His edema is pretty stable and not bothering him. No PND, orthopnea.  No chest tightness or pressure with rest or exertion.  No syncope/near syncope or TIA/amaurosis fugax symptoms.  No claudication.  ROS: A comprehensive was performed. Review of Systems  HENT: Negative for congestion and nosebleeds.   Respiratory: Negative for shortness of breath.   Gastrointestinal: Negative for blood in stool and melena.  Genitourinary: Negative for hematuria.  Musculoskeletal: Negative for joint pain.  Neurological: Positive for dizziness (Usually if he gets up too fast).  Psychiatric/Behavioral: Negative for depression. The patient is not nervous/anxious.   All other systems reviewed and are negative.   Past Medical History:  Diagnosis Date  . Anxiety disorder    Uses when necessary Xanax  . Dyslipidemia, goal LDL below 130   . Essential hypertension   . Family history of premature coronary heart disease    Paternal grandfather and father with MI/CAD in 53s (grandfather)/50s (father)  .  Kidney stone     Past Surgical History:  Procedure Laterality Date  . DOPPLER ECHOCARDIOGRAPHY  June 2010   normal EF 55% ,WITH NORMAL LV AND FUNCTION,MILDBOWING OF THE ANTERIOR MITRAL LEAFLET ,BUT NO SIGNIFICANT PROLAPSE  . KNEE SURGERY     Richard Morton  . NM MYOVIEW LTD  SEPT 19,2011   WALKED 9 1/2 MINUTES, REACHING 12 METS  WITH SLIGHTLY HYPERTENSIVE RESPONSE. NO ISCHEMIA OR INFARCTION    Current Meds  Medication Sig  . alprazolam (XANAX) 2 MG tablet Take 2 mg by mouth at bedtime as needed for sleep or anxiety.  Marland Kitchen amlodipine-atorvastatin (CADUET) 10-10 MG tablet TAKE 1 TABLET BY MOUTH AT BEDTIME  . finasteride (PROPECIA) 1 MG tablet Take 1 mg by mouth daily.  . indapamide (LOZOL) 2.5 MG tablet Take 2.5 mg by mouth daily.  . Potassium Chloride ER 20 MEQ TBCR Take 1 tablet by mouth daily. NEED OV.    Allergies  Allergen Reactions  . Hydrocodone-Acetaminophen Nausea Only and Rash   Social History   Tobacco Use  . Smoking status: Never Smoker  . Smokeless tobacco: Never Used  Substance Use Topics  . Alcohol use: No    Alcohol/week: 0.0 oz  . Drug use: No   Social History   Social History Narrative   Married father of one. Enjoys riding his mountain bike almost 3 days a week. We'll right up to 20 miles at a time. He also does light weights in gym exercises.   Does not smoke tobacco or drink alcohol.    family history includes  Alzheimer's disease in his mother; Heart attack in his father and paternal grandfather; Heart disease in his father; Hyperlipidemia in his father and mother.  Wt Readings from Last 3 Encounters:  05/05/18 187 lb 3.2 oz (84.9 kg)  01/16/18 180 lb (81.6 kg)  06/07/17 182 lb (82.6 kg)    PHYSICAL EXAM BP 118/77   Pulse 61   Ht 6\' 1"  (1.854 m)   Wt 187 lb 3.2 oz (84.9 kg)   BMI 24.70 kg/m   Physical Exam  Constitutional: He is oriented to person, place, and time. He appears well-developed and well-nourished. No distress.  Healthy-appearing.   Well-groomed  HENT:  Head: Normocephalic and atraumatic.  Neck: Normal range of motion. Neck supple. No hepatojugular reflux and no JVD present. Carotid bruit is not present. No thyromegaly present.  Cardiovascular: Normal rate, regular rhythm, intact distal pulses and normal pulses.  No extrasystoles are present. PMI is not displaced. Exam reveals no gallop and no friction rub.  No murmur heard. Pulmonary/Chest: Effort normal and breath sounds normal. No respiratory distress. He has no wheezes. He has no rales.  Abdominal: Soft. Bowel sounds are normal. He exhibits no distension. There is no tenderness.  Musculoskeletal: Normal range of motion. He exhibits no edema.  Lymphadenopathy:    He has no cervical adenopathy.  Neurological: He is alert and oriented to person, place, and time.  Psychiatric: He has a normal mood and affect. His behavior is normal. Judgment and thought content normal.  Vitals reviewed.   Adult ECG Report n/a  Other studies Reviewed: Additional studies/ records that were reviewed today include:  Recent Labs:  No recent labs available besides BUN/ Cr 21/1.1 from March 2018    ASSESSMENT / PLAN: Problem List Items Addressed This Visit    Intermittent palpitations - Primary (Chronic)    Notably improved.  Not currently taking any beta-blocker.      Relevant Orders   EKG 12-Lead (Completed)   Family history of premature coronary heart disease (Chronic)    Evaluated with Myoview in the past.  Negative.  No further symptoms.  Continue with statin and blood pressure control.      Relevant Orders   EKG 12-Lead (Completed)   Essential hypertension (Chronic)    Excellent control with amlodipine part of Caduet         Current medicines are reviewed at length with the patient today. (+/- concerns) n/a The following changes have been made: n/a  Patient Instructions  NO CHANGES WITH MEDICATIONS    Your physician wants you to follow-up in 12 MONTHS WITH DR  HARDING. You will receive a reminder letter in the mail two months in advance. If you don't receive a letter, please call our office to schedule the follow-up appointment.\   If you need a refill on your cardiac medications before your next appointment, please call your pharmacy.    Studies Ordered:   Orders Placed This Encounter  Procedures  . EKG 12-Lead      Bryan Lemmaavid Harding, M.D., M.S. Interventional Cardiologist   Pager # [phone] Phone # [phone] [address] [place], [place] [id number]

## 2018-05-05 NOTE — Patient Instructions (Signed)
NO CHANGES WITH MEDICATIONS     Your physician wants you to follow-up in 12 MONTHS WITH DR HARDING. You will receive a reminder letter in the mail two months in advance. If you don't receive a letter, please call our office to schedule the follow-up appointment.    If you need a refill on your cardiac medications before your next appointment, please call your pharmacy.  

## 2018-05-08 ENCOUNTER — Encounter: Payer: Self-pay | Admitting: Cardiology

## 2018-05-08 NOTE — Assessment & Plan Note (Signed)
Notably improved.  Not currently taking any beta-blocker.

## 2018-05-08 NOTE — Assessment & Plan Note (Signed)
Evaluated with Myoview in the past.  Negative.  No further symptoms.  Continue with statin and blood pressure control.

## 2018-05-08 NOTE — Assessment & Plan Note (Signed)
Excellent control with amlodipine part of Caduet

## 2018-05-09 ENCOUNTER — Other Ambulatory Visit: Payer: Self-pay | Admitting: Cardiology

## 2019-05-08 ENCOUNTER — Other Ambulatory Visit: Payer: Self-pay | Admitting: Cardiology

## 2019-08-09 ENCOUNTER — Other Ambulatory Visit: Payer: Self-pay | Admitting: Cardiology

## 2019-09-10 ENCOUNTER — Ambulatory Visit: Payer: 59 | Admitting: Cardiology

## 2019-09-22 ENCOUNTER — Ambulatory Visit: Payer: 59 | Admitting: Cardiology

## 2019-09-24 ENCOUNTER — Other Ambulatory Visit: Payer: Self-pay

## 2019-09-24 ENCOUNTER — Ambulatory Visit (INDEPENDENT_AMBULATORY_CARE_PROVIDER_SITE_OTHER): Payer: 59 | Admitting: Cardiology

## 2019-09-24 ENCOUNTER — Encounter: Payer: Self-pay | Admitting: Cardiology

## 2019-09-24 VITALS — BP 104/60 | HR 75 | Temp 97.9°F | Ht 72.0 in | Wt 191.0 lb

## 2019-09-24 DIAGNOSIS — R002 Palpitations: Secondary | ICD-10-CM

## 2019-09-24 DIAGNOSIS — I10 Essential (primary) hypertension: Secondary | ICD-10-CM

## 2019-09-24 DIAGNOSIS — Z8249 Family history of ischemic heart disease and other diseases of the circulatory system: Secondary | ICD-10-CM

## 2019-09-24 DIAGNOSIS — E785 Hyperlipidemia, unspecified: Secondary | ICD-10-CM | POA: Diagnosis not present

## 2019-09-24 NOTE — Patient Instructions (Signed)
Medication Instructions:  No changes *If you need a refill on your cardiac medications before your next appointment, please call your pharmacy*  Lab Work: Not needed If you have labs (blood work) drawn today and your tests are completely normal, you will receive your results only by: . MyChart Message (if you have MyChart) OR . A paper copy in the mail If you have any lab test that is abnormal or we need to change your treatment, we will call you to review the results.  Testing/Procedures: Not needed  Follow-Up: At [facility], you and your health needs are our priority.  As part of our continuing mission to provide you with exceptional heart care, we have created designated Provider Care Teams.  These Care Teams include your primary Cardiologist (physician) and Advanced Practice Providers (APPs -  Physician Assistants and Nurse Practitioners) who all work together to provide you with the care you need, when you need it.  Your next appointment:   12 month(s)  The format for your next appointment:   In Person  Provider:   David Harding, MD  Other Instructions  

## 2019-09-24 NOTE — Progress Notes (Signed)
Primary Care Provider: Tracey HarriesBouska, David, MD Cardiologist: No primary care provider on file. Electrophysiologist:   Clinic Note: Chief Complaint  Patient presents with  . Follow-up    12 MONTHS    HPI:    Richard Morton is a 48 y.o. male with a PMH below who presents today for history of palpitations hypertension.  Richard Lairimothy Palazzo was last seen on June 02, 2017.  Was doing well no major issues.  Few breakthrough episodes of increased heart rates that broke with vagal maneuvers.  Otherwise doing well.  Not currently on beta-blocker.  Recent Hospitalizations: None  Reviewed  CV studies:    The following studies were reviewed today: (if available, images/films reviewed: From Epic Chart or Care Everywhere) . None:  Interval History:   Richard Lairimothy Matt returns here today for delayed annual follow-up.  Marcial Pacasimothy says he is doing very well.  No major symptoms.  His blood pressures been well controlled and is not noticing any palpitations.  He is taking Caduet.  Not using any beta-blocker at this point.  CV Review of Symptoms (Summary) Cardiovascular ROS: no chest pain or dyspnea on exertion negative for - edema, irregular heartbeat, orthopnea, palpitations, paroxysmal nocturnal dyspnea, rapid heart rate, shortness of breath or Syncope/near syncope, TIA/amaurosis fugax; claudication  The patient does not have symptoms concerning for COVID-19 infection (fever, chills, cough, or new shortness of breath).  The patient is practicing social distancing. ++ Masking.  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  HENT: Negative for congestion and nosebleeds.   Respiratory: Negative for shortness of breath.   Gastrointestinal: Negative for blood in stool and  melena.  Genitourinary: Negative for hematuria.  Psychiatric/Behavioral: Negative for memory loss. The patient is not nervous/anxious and does not have insomnia.   All other systems reviewed and are negative.   I have reviewed and (if needed) personally updated the patient's problem list, medications, allergies, past medical and surgical history, social and family history.   PAST MEDICAL HISTORY   Past Medical History:  Diagnosis Date  . Anxiety disorder    Uses when necessary Xanax  . Dyslipidemia, goal LDL below 130   . Essential hypertension   . Family history of premature coronary heart disease    Paternal grandfather and father with MI/CAD in 6440s (grandfather)/50s (father)  . Kidney stone     PAST SURGICAL HISTORY   Past Surgical History:  Procedure Laterality Date  . KNEE SURGERY     [place]  . NM MYOVIEW LTD  SEPT 19,2011   WALKED 9 1/2 MINUTES, REACHING 12 METS  WITH SLIGHTLY HYPERTENSIVE RESPONSE. NO ISCHEMIA OR INFARCTION  . TRANSTHORACIC ECHOCARDIOGRAM  June 2010   normal EF 55% ,WITH NORMAL LV AND FUNCTION,MILDBOWING OF THE ANTERIOR MITRAL LEAFLET ,BUT NO SIGNIFICANT PROLAPSE    MEDICATIONS/ALLERGIES   Current Meds  Medication Sig  . alprazolam (XANAX) 2 MG tablet Take 2 mg by mouth at bedtime as needed for sleep or anxiety.  Marland Kitchen. amlodipine-atorvastatin (CADUET) 10-10 MG tablet Take 1 tablet by mouth daily. Office visit needed before additional refills  . finasteride (PROPECIA) 1 MG tablet Take 1 mg by mouth daily.  . indapamide (LOZOL) 2.5 MG tablet Take 2.5 mg by mouth daily.  . Potassium Chloride ER 20 MEQ TBCR Take 1 tablet  by mouth daily. NEED OV.    Allergies  Allergen Reactions  . Hydrocodone-Acetaminophen Nausea Only and Rash    SOCIAL HISTORY/FAMILY HISTORY   Social History   Tobacco Use  . Smoking status: Never Smoker  . Smokeless tobacco: Never Used  Substance Use Topics  . Alcohol use: No    Alcohol/week: 0.0 standard drinks  . Drug use:  No   Social History   Social History Narrative   Married father of one. Enjoys riding his mountain bike almost 3 days a week. We'll right up to 20 miles at a time. He also does light weights in gym exercises.   Does not smoke tobacco or drink alcohol.    Family History family history includes Alzheimer's disease in his mother; Heart attack in his father and paternal grandfather; Heart disease in his father; Hyperlipidemia in his father and mother.   OBJCTIVE -PE, EKG, labs   Wt Readings from Last 3 Encounters:  09/24/19 191 lb (86.6 kg)  05/05/18 187 lb 3.2 oz (84.9 kg)  01/16/18 180 lb (81.6 kg)    Physical Exam: BP 104/60 (BP Location: Left Arm, Patient Position: Sitting, Cuff Size: Normal)   Pulse 75   Temp 97.9 F (36.6 C)   Ht 6' (1.829 m)   Wt 191 lb (86.6 kg)   BMI 25.90 kg/m  Physical Exam  Constitutional: He is oriented to person, place, and time. He appears well-developed and well-nourished. No distress.  Healthy-appearing.  Well-groomed  HENT:  Head: Normocephalic and atraumatic.  Neck: Normal range of motion. Neck supple. No JVD present. Carotid bruit is not present.  Cardiovascular: Normal rate, regular rhythm, normal heart sounds and intact distal pulses. Exam reveals no gallop and no friction rub.  No murmur heard. Pulmonary/Chest: Effort normal and breath sounds normal. No respiratory distress. He has no wheezes. He has no rales.  Musculoskeletal: Normal range of motion.        General: No edema.  Neurological: He is alert and oriented to person, place, and time.  Psychiatric: He has a normal mood and affect. His behavior is normal. Judgment and thought content normal.  Vitals reviewed.    Adult ECG Report  Rate: 75 ;  Rhythm: normal sinus rhythm and Normal axis, intervals and durations;   Narrative Interpretation: Stable EKG  Recent Labs:  n/a  No results found for: CHOL, HDL, LDLCALC, LDLDIRECT, TRIG, CHOLHDL Lab Results  Component Value Date    CREATININE 1.10 01/16/2018   BUN 21 (H) 01/16/2018   NA 135 01/16/2018   K 3.6 01/16/2018   CL 101 01/16/2018   CO2 27 01/16/2018    ASSESSMENT/PLAN    Problem List Items Addressed This Visit    Intermittent palpitations (Chronic)    Doing very well.  Likely related to anxiety.  Asked questions about potentially being on more stable medication besides using as needed Xanax.  -> Recommend that he discuss possible SSRI or SNRI with PCP.      Relevant Orders   EKG 12-Lead   Essential hypertension - Primary (Chronic)    Well-controlled blood pressure on current dose of amlodipine (as part of Caduet)      Relevant Orders   EKG 12-Lead   Dyslipidemia, goal LDL below 130 (Chronic)    On 10 mg atorvastatin as part of Caduet.  Monitored by PCP.      Family history of premature coronary heart disease (Chronic)    No active cardiac symptoms.  Wants to continue to follow-up  with cardiology. Recommendations are to continue adequate blood pressure and cholesterol control. We will consider evaluation with coronary calcium score in the future.          COVID-19 Education: The signs and symptoms of COVID-19 were discussed with the patient and how to seek care for testing (follow up with PCP or arrange E-visit).   The importance of social distancing was discussed today.  I spent a total of 22minutes with the patient and chart review. >  50% of the time was spent in direct patient consultation.  Additional time spent with chart review (studies, outside notes, etc): 3 Total Time: 18 min   Current medicines are reviewed at length with the patient today.  (+/- concerns) n/a   Patient Instructions / Medication Changes & Studies & Tests Ordered   Patient Instructions  Medication Instructions:  No changes *If you need a refill on your cardiac medications before your next appointment, please call your pharmacy*  Lab Work: Not needed If you have labs (blood work) drawn today and  your tests are completely normal, you will receive your results only by: Marland Kitchen MyChart Message (if you have MyChart) OR . A paper copy in the mail If you have any lab test that is abnormal or we need to change your treatment, we will call you to review the results.  Testing/Procedures: Not  needed  Follow-Up: At [facility], you and your health needs are our priority.  As part of our continuing mission to provide you with exceptional heart care, we have created designated Provider Care Teams.  These Care Teams include your primary Cardiologist (physician) and Advanced Practice Providers (APPs -  Physician Assistants and Nurse Practitioners) who all work together to provide you with the care you need, when you need it.  Your next appointment:   12 month(s)  The format for your next appointment:   In Person  Provider:   Glenetta Hew, MD  Other Instructions     Studies Ordered:   Orders Placed This Encounter  Procedures  . EKG 12-Lead     Glenetta Hew, M.D., M.S. Interventional Cardiologist   Pager # [phone] Phone # [phone] [address] [place], [place] [address]   Thank you for choosing Heartcare at [facility]!!

## 2019-09-26 ENCOUNTER — Encounter: Payer: Self-pay | Admitting: Cardiology

## 2019-09-26 NOTE — Assessment & Plan Note (Signed)
Well-controlled blood pressure on current dose of amlodipine (as part of Caduet)

## 2019-09-26 NOTE — Assessment & Plan Note (Addendum)
On 10 mg atorvastatin as part of Caduet.  Monitored by PCP.

## 2019-09-26 NOTE — Assessment & Plan Note (Signed)
No active cardiac symptoms.  Wants to continue to follow-up with cardiology. Recommendations are to continue adequate blood pressure and cholesterol control. We will consider evaluation with coronary calcium score in the future.

## 2019-09-26 NOTE — Assessment & Plan Note (Signed)
Doing very well.  Likely related to anxiety.  Asked questions about potentially being on more stable medication besides using as needed Xanax.  -> Recommend that he discuss possible SSRI or SNRI with PCP.

## 2020-01-07 IMAGING — CT CT RENAL STONE PROTOCOL
2 of 4 series · 16 of 46 positions shown, 18 images · non-contrast
Comparison: Abdominal radiograph dated 05/18/2013 and CT dated
11/10/2010

CLINICAL DATA: 46-year-old male with flank pain radiating to the
right lower quadrant.

EXAM:
CT ABDOMEN AND PELVIS WITHOUT CONTRAST
TECHNIQUE: Multidetector CT imaging of the abdomen and pelvis was performed
following the standard protocol without IV contrast.

[Series 2: axial st · axial · 0.83mm/px · z∈[-679,-214]mm · 13 of 103 slices shown, 15 images]
[im 5/103  soft-tissue]
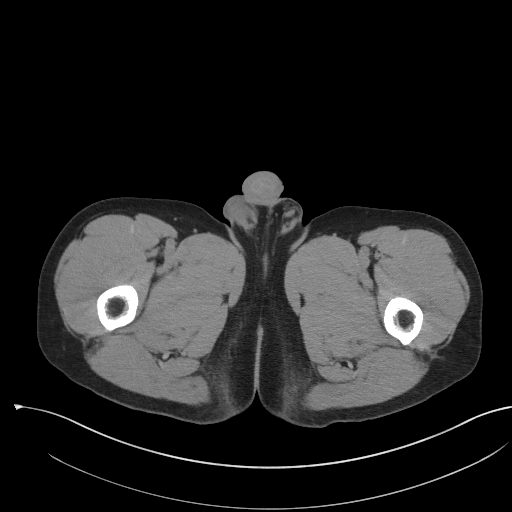
[im 5/103  bone]
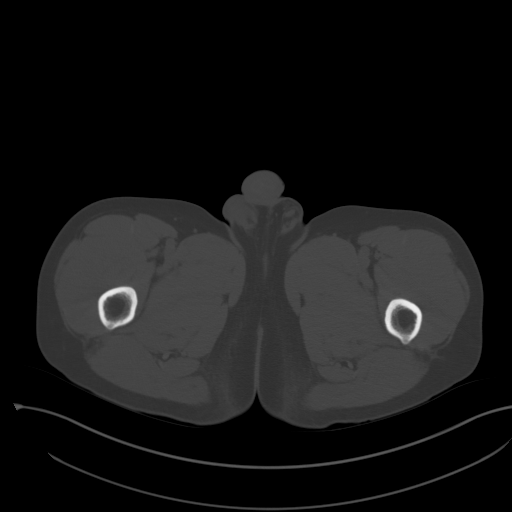
[im 13/103  soft-tissue]
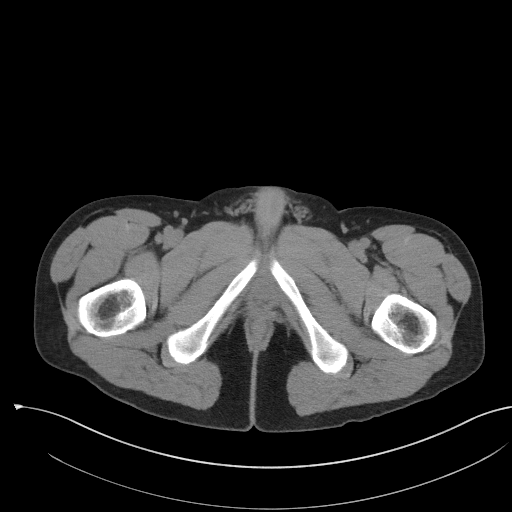
[im 22/103  soft-tissue]
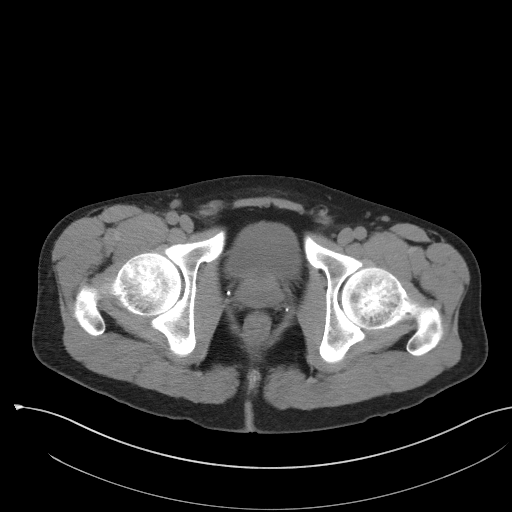
[im 30/103  soft-tissue]
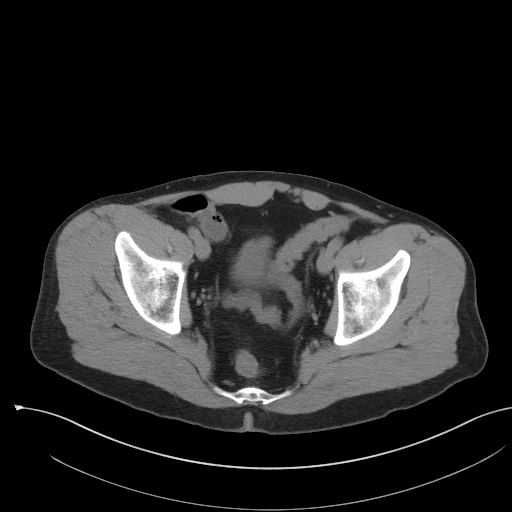
[im 35/103  soft-tissue]
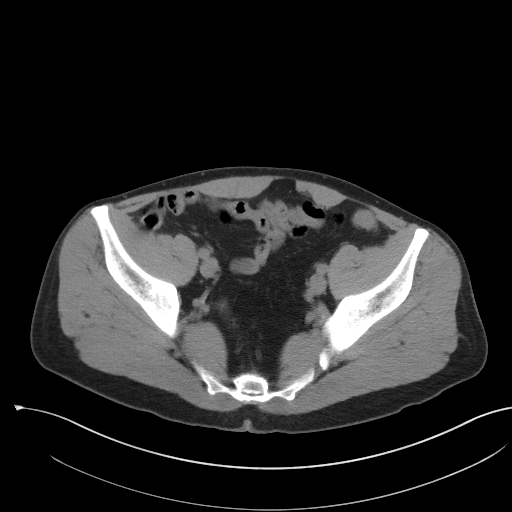
[im 43/103  soft-tissue]
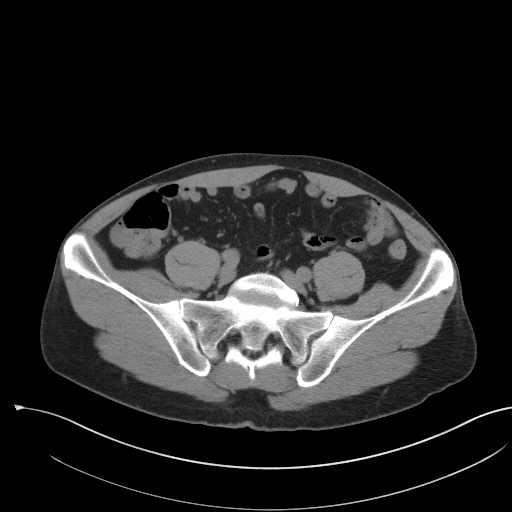
[im 52/103  soft-tissue]
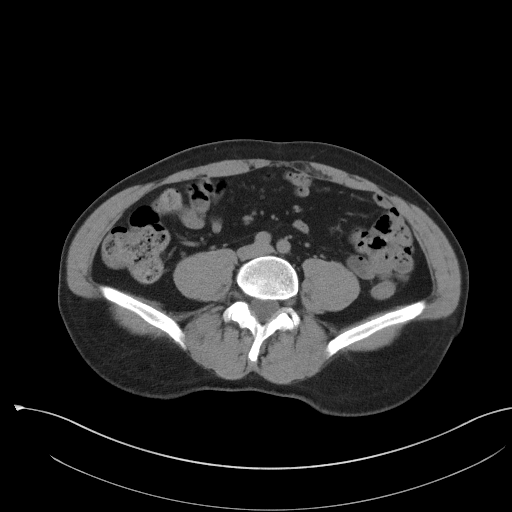
[im 60/103  soft-tissue]
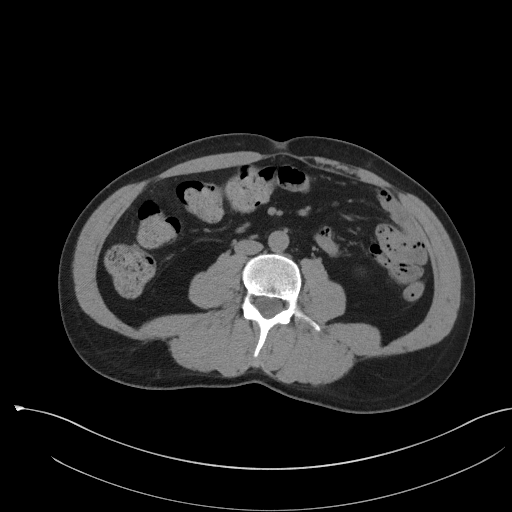
[im 69/103  soft-tissue]
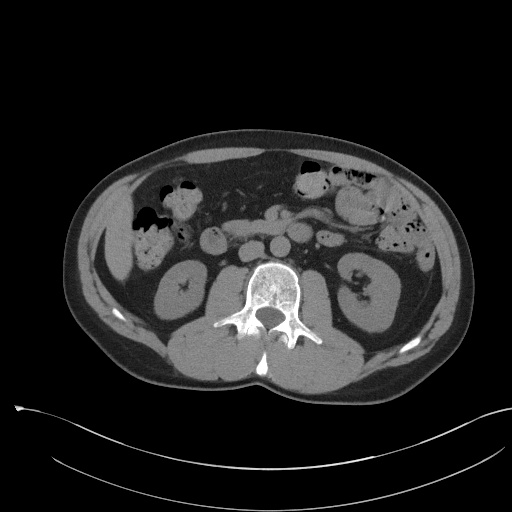
[im 69/103  bone]
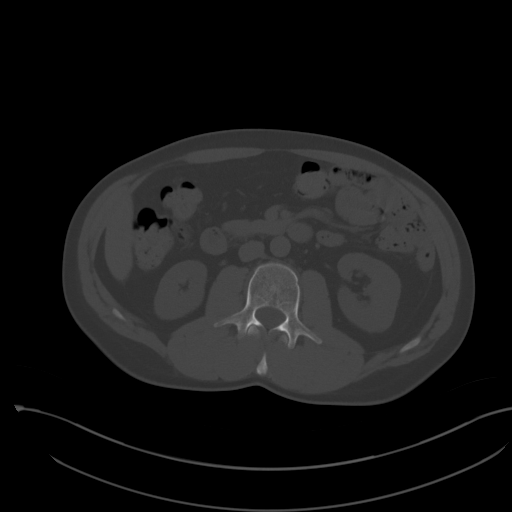
[im 73/103  soft-tissue]
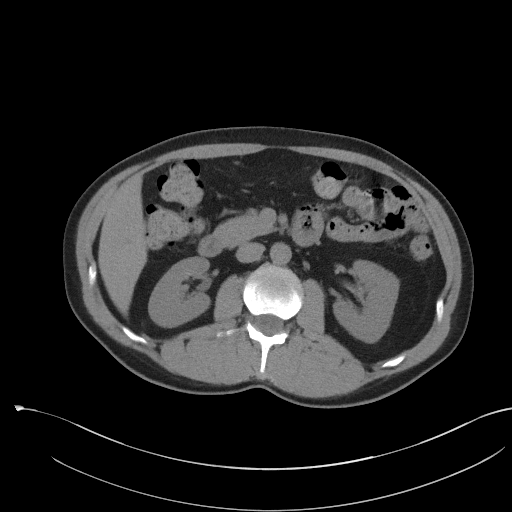
[im 81/103  soft-tissue]
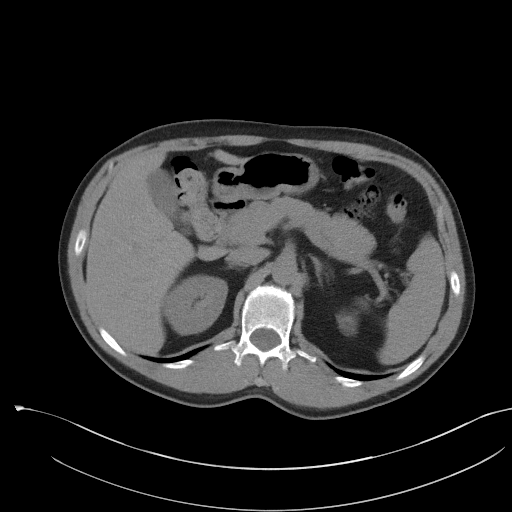
[im 90/103  soft-tissue]
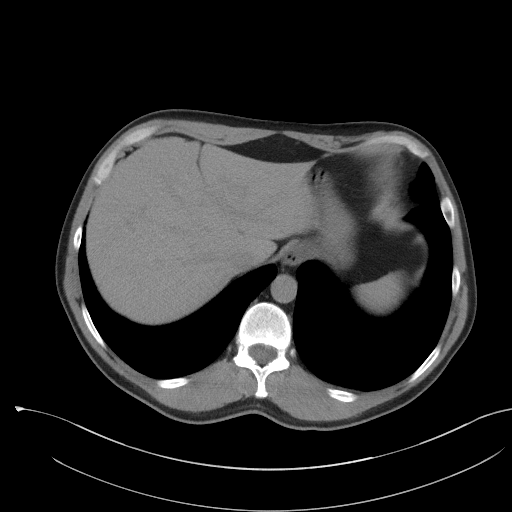
[im 98/103  soft-tissue]
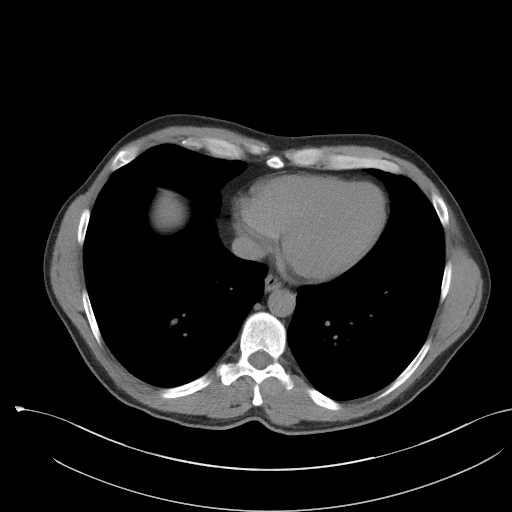

[Series 5: coronal st · coronal · 0.98mm/px · 3 of 83 slices shown]
[im 28/83  soft-tissue]
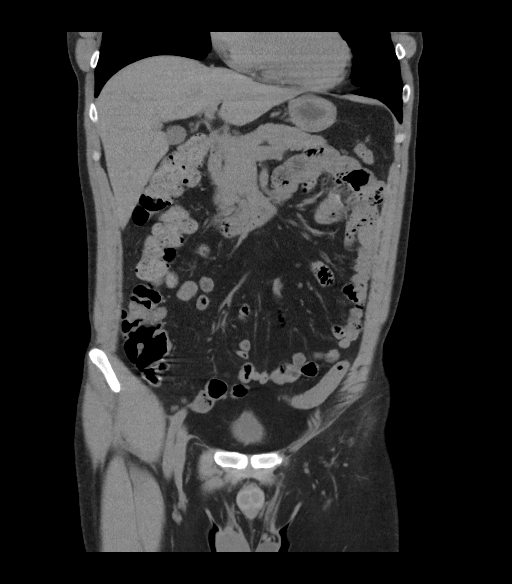
[im 37/83  soft-tissue]
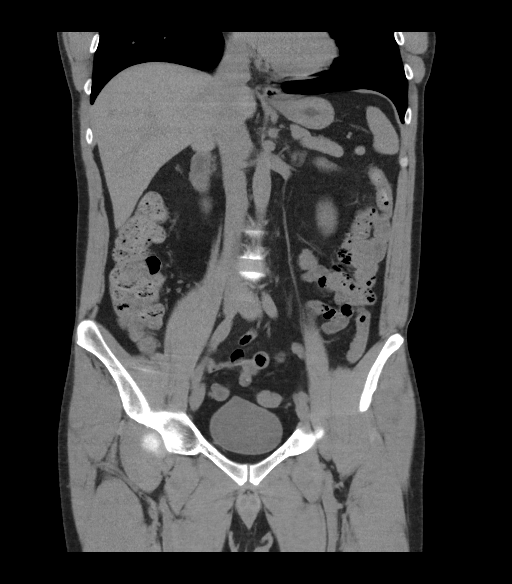
[im 46/83  soft-tissue]
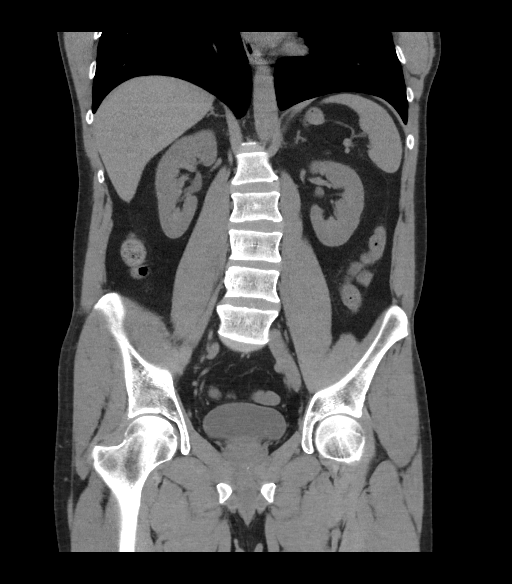

[16 of 46 positions shown; findings below may reference images not displayed]

FINDINGS: Evaluation of this exam is limited in the absence of intravenous
contrast.

Lower chest: The visualized lung bases are clear.

No intra-abdominal free air or free fluid.

Hepatobiliary: Small calcific focus in the right lobe of the liver
along the diaphragm most likely a granuloma. The liver is otherwise
unremarkable. No intrahepatic biliary ductal dilatation. The
gallbladder is unremarkable.

Pancreas: Unremarkable. No pancreatic ductal dilatation or
surrounding inflammatory changes.

Spleen: Normal in size without focal abnormality.

Adrenals/Urinary Tract: The adrenal glands are unremarkable.
Multiple punctate nonobstructing left renal calculi may be present.
There is no hydronephrosis. The right kidney is unremarkable. The
visualized ureters and urinary bladder are unremarkable.

Stomach/Bowel: There is no bowel obstruction or active inflammation.
The appendix is normal.

Vascular/Lymphatic: The abdominal aorta and IVC are grossly
unremarkable on this noncontrast CT. No portal venous gas. There is
no adenopathy.

Reproductive: The prostate and seminal vesicles are grossly
unremarkable. No pelvic mass.

Other: Small fat containing umbilical hernia.

Musculoskeletal: A 2 cm mixed density lesion in the left iliac bone
adjacent to the SI joint is not well characterized but appears
nonaggressive and may represent a hemangioma. This is stable
compared to the study of 1011. No acute osseous pathology.
IMPRESSION: 1. No hydronephrosis or obstructing stone.
2. No bowel obstruction or active inflammation.  Normal appendix.

## 2020-12-20 ENCOUNTER — Ambulatory Visit (INDEPENDENT_AMBULATORY_CARE_PROVIDER_SITE_OTHER): Payer: 59 | Admitting: Cardiology

## 2020-12-20 ENCOUNTER — Other Ambulatory Visit: Payer: Self-pay

## 2020-12-20 ENCOUNTER — Encounter: Payer: Self-pay | Admitting: Cardiology

## 2020-12-20 VITALS — BP 118/66 | HR 76 | Ht 72.0 in | Wt 187.0 lb

## 2020-12-20 DIAGNOSIS — E785 Hyperlipidemia, unspecified: Secondary | ICD-10-CM

## 2020-12-20 DIAGNOSIS — R002 Palpitations: Secondary | ICD-10-CM

## 2020-12-20 DIAGNOSIS — Z8249 Family history of ischemic heart disease and other diseases of the circulatory system: Secondary | ICD-10-CM | POA: Diagnosis not present

## 2020-12-20 DIAGNOSIS — I10 Essential (primary) hypertension: Secondary | ICD-10-CM

## 2020-12-20 NOTE — Assessment & Plan Note (Signed)
We talked about timing of when we should check coronary calcium 4.  Think the best timing would be very pleased turned 51.  Plan will be to order a coronary calcium score prior to his follow-up in 2024.

## 2020-12-20 NOTE — Assessment & Plan Note (Signed)
Blood pressure controlled on current dose of amlodipine.  Has not required beta-blocker.  Continue to monitor.

## 2020-12-20 NOTE — Assessment & Plan Note (Addendum)
He is only on 10 mg of atorvastatin as part of his Caduet.  Per patient, he was told by his PCP that his lipids look good. Would recommend target LDL less than 100.

## 2020-12-20 NOTE — Progress Notes (Signed)
Primary Care Provider: Tracey Harries, MD Cardiologist: No primary care provider on file. Electrophysiologist: None  Clinic Note: Chief Complaint  Patient presents with  . Follow-up    Doing well.  No major issues  . Palpitations    Has not had any in the last year.   ===================================  ASSESSMENT/PLAN   Problem List Items Addressed This Visit    Intermittent palpitations - Primary (Chronic)    Pretty stable.  He cannot recall last time he had an episode of palpitations.  He has not required any beta-blocker doses for breakthrough.  Staying adequately hydrated.  Avoiding stress.      Essential hypertension (Chronic)    Blood pressure controlled on current dose of amlodipine.  Has not required beta-blocker.  Continue to monitor.      Dyslipidemia, goal LDL below 100 (Chronic)    He is only on 10 mg of atorvastatin as part of his Caduet.  Per patient, he was told by his PCP that his lipids look good. Would recommend target LDL less than 100.      Family history of premature coronary heart disease (Chronic)    We talked about timing of when we should check coronary calcium 4.  Think the best timing would be very pleased turned 51.  Plan will be to order a coronary calcium score prior to his follow-up in 2024.        ===================================  HPI:    Richard Morton is a 50 y.o. male with a PMH notable for HTN and palpitations who presents today for delayed annual follow-up.  Richard Morton was last seen in November 2020.  He is doing well.  No major issues.  Blood pressure controlled.  No notable palpitations.  Had not been using his beta-blocker.  Only taking Caduet.  Recent Hospitalizations: None  Reviewed  CV studies:    The following studies were reviewed today: (if available, images/films reviewed: From Epic Chart or Care Everywhere) . None:   Interval History:   Richard Morton returns here today for annual follow-up doing  very well.  He said he had maybe 1 borderline panic attack this past week having come back from vacation coming into a lot of work, but otherwise he was able to go outside to get under control.  This happened before he had any increase in heart rate or blood pressure.  He otherwise doing very well with no cardiac symptoms.  Remains very active.  PCP is following his lipids, and he was told it was very good.j  CV Review of Symptoms (Summary) Cardiovascular ROS: no chest pain or dyspnea on exertion negative for - edema, irregular heartbeat, orthopnea, palpitations, paroxysmal nocturnal dyspnea, rapid heart rate, shortness of breath or Syncope/near syncope or TIA/amaurosis fugax or claudication  The patient does not have symptoms concerning for COVID-19 infection (fever, chills, cough, or new shortness of breath).   REVIEWED OF SYSTEMS   Review of Systems  Constitutional: Negative for malaise/fatigue and weight loss.  HENT: Negative for congestion.   Respiratory: Negative for cough and shortness of breath.   Gastrointestinal: Negative for melena.  Genitourinary: Negative for hematuria.  Musculoskeletal: Negative for falls and joint pain.  Neurological: Negative for dizziness and headaches.  Psychiatric/Behavioral: Negative for depression. The patient is nervous/anxious (1 or 2 episodes, but easily broken by getting away). The patient does not have insomnia.     I have reviewed and (if needed) personally updated the patient's problem list, medications, allergies, past medical and  surgical history, social and family history.   PAST MEDICAL HISTORY   Past Medical History:  Diagnosis Date  . Anxiety disorder    Uses when necessary Xanax  . Dyslipidemia, goal LDL below 130   . Essential hypertension   . Family history of premature coronary heart disease    Paternal grandfather and father with MI/CAD in 7040s (grandfather)/50s (father)  . Kidney stone     PAST SURGICAL HISTORY   Past  Surgical History:  Procedure Laterality Date  . KNEE SURGERY     [place]  . NM MYOVIEW LTD  SEPT 19,2011   WALKED 9 1/2 MINUTES, REACHING 12 METS  WITH SLIGHTLY HYPERTENSIVE RESPONSE. NO ISCHEMIA OR INFARCTION  . TRANSTHORACIC ECHOCARDIOGRAM  June 2010   normal EF 55% ,WITH NORMAL LV AND FUNCTION,MILDBOWING OF THE ANTERIOR MITRAL LEAFLET ,BUT NO SIGNIFICANT PROLAPSE    Immunization History  Administered Date(s) Administered  . PFIZER(Purple Top)SARS-COV-2 Vaccination 10/19/2020    MEDICATIONS/ALLERGIES   Current Meds  Medication Sig  . albuterol (VENTOLIN HFA) 108 (90 Base) MCG/ACT inhaler Inhale into the lungs.  Marland Kitchen. alprazolam (XANAX) 2 MG tablet Take 2 mg by mouth at bedtime as needed for sleep or anxiety.  Marland Kitchen. amlodipine-atorvastatin (CADUET) 10-10 MG tablet Take 1 tablet by mouth daily. Office visit needed before additional refills  . finasteride (PROPECIA) 1 MG tablet Take 1 mg by mouth daily.  . indapamide (LOZOL) 2.5 MG tablet Take 2.5 mg by mouth daily.  Marland Kitchen. PARoxetine (PAXIL) 20 MG tablet Take 1 tablet by mouth daily.  . phentermine 30 MG capsule Take by mouth as needed.  . Potassium Chloride ER 20 MEQ TBCR Take 1 tablet by mouth daily. NEED OV.    Allergies  Allergen Reactions  . Hydrocodone-Acetaminophen Nausea Only and Rash    SOCIAL HISTORY/FAMILY HISTORY   Reviewed in Epic:  Pertinent findings:  Social History   Tobacco Use  . Smoking status: Never Smoker  . Smokeless tobacco: Never Used  Substance Use Topics  . Alcohol use: No    Alcohol/week: 0.0 standard drinks  . Drug use: No   Social History   Social History Narrative   Married father of one. Enjoys riding his mountain bike almost 3 days a week. We'll right up to 20 miles at a time. He also does light weights in gym exercises.   Does not smoke tobacco or drink alcohol.    OBJCTIVE -PE, EKG, labs   Wt Readings from Last 3 Encounters:  12/20/20 187 lb (84.8 kg)  09/24/19 191 lb (86.6 kg)  05/05/18  187 lb 3.2 oz (84.9 kg)    Physical Exam: BP 118/66   Pulse 76   Ht 6' (1.829 m)   Wt 187 lb (84.8 kg)   SpO2 99%   BMI 25.36 kg/m  Physical Exam Vitals reviewed.  Constitutional:      General: He is not in acute distress.    Appearance: Normal appearance. He is normal weight. He is not ill-appearing or toxic-appearing.  HENT:     Head: Normocephalic and atraumatic.  Neck:     Vascular: No carotid bruit, hepatojugular reflux or JVD.  Cardiovascular:     Rate and Rhythm: Normal rate and regular rhythm.  No extrasystoles are present.    Chest Wall: PMI is not displaced.     Pulses: Normal pulses.     Heart sounds: Normal heart sounds. No murmur heard. No friction rub. No gallop.   Pulmonary:     Effort: Pulmonary  effort is normal. No respiratory distress.     Breath sounds: Normal breath sounds.  Chest:     Chest wall: No tenderness.  Musculoskeletal:        General: No swelling. Normal range of motion.     Cervical back: Normal range of motion and neck supple.  Neurological:     General: No focal deficit present.     Mental Status: He is alert and oriented to person, place, and time.     Motor: No weakness.  Psychiatric:        Mood and Affect: Mood normal.        Behavior: Behavior normal.        Thought Content: Thought content normal.        Judgment: Judgment normal.     Adult ECG Report  Rate: 76;  Rhythm: normal sinus rhythm; Normal axis, intervals and durations  Narrative Interpretation: Normal EKG   Recent Labs: Most recently checked in October 2021 by PCP. No results found for: CHOL, HDL, LDLCALC, LDLDIRECT, TRIG, CHOLHDL Lab Results  Component Value Date   CREATININE 1.10 01/16/2018   BUN 21 (H) 01/16/2018   NA 135 01/16/2018   K 3.6 01/16/2018   CL 101 01/16/2018   CO2 27 01/16/2018   CBC Latest Ref Rng & Units 01/16/2018 07/01/2015 06/01/2011  WBC 4.0 - 10.5 K/uL 5.3 5.5 9.1  Hemoglobin 13.0 - 17.0 g/dL 16.1 09.6 04.5  Hematocrit 39.0 - 52.0 %  47.0 44.4 43.2  Platelets 150 - 400 K/uL 217 221 181    Lab Results  Component Value Date   TSH 2.28 09/10/2014    ==================================================  COVID-19 Education: The signs and symptoms of COVID-19 were discussed with the patient and how to seek care for testing (follow up with PCP or arrange E-visit).   The importance of social distancing and COVID-19 vaccination was discussed today. The patient is practicing social distancing & Masking.   I spent a total of with the patient spent in direct patient consultation.  Additional time spent with chart review  / charting (studies, outside notes, etc): 3 min Total Time: 25 min   Current medicines are reviewed at length with the patient today.  (+/- concerns) n/a  This visit occurred during the SARS-CoV-2 public health emergency.  Safety protocols were in place, including screening questions prior to the visit, additional usage of staff PPE, and extensive cleaning of exam room while observing appropriate contact time as indicated for disinfecting solutions.  Notice: This dictation was prepared with Dragon dictation along with smaller phrase technology. Any transcriptional errors that result from this process are unintentional and may not be corrected upon review.  Patient Instructions / Medication Changes & Studies & Tests Ordered   Patient Instructions  Medication Instructions:  NO CHANGES *If you need a refill on your cardiac medications before your next appointment, please call your pharmacy*   Lab Work: NOT NEEDED If you have labs (blood work) drawn today and your tests are completely normal, you will receive your results only by: Marland Kitchen MyChart Message (if you have MyChart) OR . A paper copy in the mail If you have any lab test that is abnormal or we need to change your treatment, we will call you to review the results.   Testing/Procedures: NOT NEEDED   Follow-Up: At [facility], you and  your health needs are our priority.  As part of our continuing mission to provide you with exceptional heart care, we have  created designated Provider Care Teams.  These Care Teams include your primary Cardiologist (physician) and Advanced Practice Providers (APPs -  Physician Assistants and Nurse Practitioners) who all work together to provide you with the care you need, when you need it.  We recommend signing up for the patient portal called "MyChart".  Sign up information is provided on this After Visit Summary.  MyChart is used to connect with patients for Virtual Visits (Telemedicine).  Patients are able to view lab/test results, encounter notes, upcoming appointments, etc.  Non-urgent messages can be sent to your provider as well.   To learn more about what you can do with MyChart, go to [email or web address].    Your next appointment:   12 month(s)  The format for your next appointment:   In Person  Provider:   Bryan Lemma, MD       Studies Ordered:   No orders of the defined types were placed in this encounter.    Bryan Lemma, M.D., M.S. Interventional Cardiologist   Pager # [phone] Phone # [phone] [address] [place], [place] [address]   Thank you for choosing Heartcare at [facility]!!

## 2020-12-20 NOTE — Assessment & Plan Note (Signed)
Pretty stable.  He cannot recall last time he had an episode of palpitations.  He has not required any beta-blocker doses for breakthrough.  Staying adequately hydrated.  Avoiding stress.

## 2020-12-20 NOTE — Patient Instructions (Signed)

## 2024-09-08 ENCOUNTER — Ambulatory Visit: Attending: Cardiology | Admitting: Cardiology

## 2024-09-08 VITALS — BP 118/80 | HR 88 | Ht 72.0 in | Wt 190.0 lb

## 2024-09-08 DIAGNOSIS — R002 Palpitations: Secondary | ICD-10-CM

## 2024-09-08 DIAGNOSIS — R5381 Other malaise: Secondary | ICD-10-CM | POA: Diagnosis not present

## 2024-09-08 DIAGNOSIS — E785 Hyperlipidemia, unspecified: Secondary | ICD-10-CM | POA: Diagnosis not present

## 2024-09-08 DIAGNOSIS — F419 Anxiety disorder, unspecified: Secondary | ICD-10-CM

## 2024-09-08 DIAGNOSIS — I10 Essential (primary) hypertension: Secondary | ICD-10-CM | POA: Diagnosis not present

## 2024-09-08 MED ORDER — COQ10 100 MG PO CAPS: 300.0000 mg | ORAL_CAPSULE | Freq: Every day | ORAL | Status: AC

## 2024-09-08 MED ORDER — FISH OIL 1000 MG PO CPDR: 3000.0000 mg | DELAYED_RELEASE_CAPSULE | Freq: Every day | ORAL | Status: AC

## 2024-09-08 NOTE — Patient Instructions (Signed)
 Medication Instructions:  TO HELP WITH LOWERING YOUR TRIGLYCERIDE  START FISH/KRILL OIL  INCREASE UNTIL REACH 3000 MG A DAY   START CoQ10  INCREASE TO 300 MG  A DAY    *If you need a refill on your cardiac medications before your next appointment, please call your pharmacy*   Lab Work:  If you have labs (blood work) drawn today and your tests are completely normal, you will receive your results only by: MyChart Message (if you have MyChart) OR A paper copy in the mail If you have any lab test that is abnormal or we need to change your treatment, we will call you to review the results.   Testing/Procedures:  NOT NEEDED  Follow-Up: At [facility], you and your health needs are our priority.  As part of our continuing mission to provide you with exceptional heart care, we have created designated Provider Care Teams.  These Care Teams include your primary Cardiologist (physician) and Advanced Practice Providers (APPs -  Physician Assistants and Nurse Practitioners) who all work together to provide you with the care you need, when you need it.     Your next appointment:   12 month(s)  The format for your next appointment:   In Person  Provider:   Alm Clay, MD or Josefa Beauvais, NP, Lum Louis, NP, or Thom Sluder, [place]         Other Instructions

## 2024-09-08 NOTE — Progress Notes (Signed)
 Cardiology Office Note:  .   Date:  09/13/2024  ID:  Richard Morton, DOB [birth year], MRN [id number] PCP: Pura Lenis, MD  [facility] Providers Cardiologist:  Lenis Clay, MD     Chief Complaint  Patient presents with   New Patient (Initial Visit)    Delayed follow-up after years.  Reestablishing care.    Patient Profile: .     Richard Morton is a somewhat anxious 53 y.o. male with a PMH notable for HTN and palpitations who presents here essentially to reestablish care at the request of Pura Lenis, MD.    Richard Morton was last seen on December 20, 2020 follow-up to discuss his palpitations.  He had had a borderline panic attack leading up to the visit prior to going on medication but it seemed improved.  Had increased heart rate and blood pressure.  No chest pain or pressure.  Otherwise negative for symptoms.=> Recommended staying adequately hydrated.  He has not required as needed beta-blocker for breakthrough spells trying to avoid stress.  Was on amlodipine with relatively well-controlled blood pressure.  Target LDL less than 100.  We Discussed Potentially Checking a Coronary Calcium Score at a 2-year follow-up.  Subjective  Discussed the use of AI scribe software for clinical note transcription with the patient, who gave verbal consent to proceed.  History of Present Illness Richard Morton is a 53 year old male who presents for an annual check-up.  He experiences increased fatigue and shortness of breath during physical activities, such as playing with his son. He notes occasional non-sharp chest pain and palpitations, which are decreasing in frequency but can be triggered by anxiety. No routine chest pain or significant palpitations noted.  He has a history of hyperlipidemia, with triglyceride levels elevated at 371 mg/dL in 7975, up from 785 mg/dL in 7976. His cholesterol was last checked in December of the previous year, with a total cholesterol of 180 mg/dL, HDL  of 27 mg/dL, and LDL of 91 mg/dL. He is currently taking atorvastatin 10 mg daily for cholesterol management.  He is on amlodipine 10 mg daily for blood pressure management, which he reports has been stable. He also takes Paxil 20 mg daily for anxiety, and Xanax XR nightly. He uses albuterol as needed, particularly during this time of year when he may require it more frequently.  Recent lab workup showed a glucose level of 112 mg/dL, and a previous fasting glucose of 116 mg/dL.  He has a history of using phentermine as needed, and takes finasteride for prostate health. He also uses Lozol for gastrointestinal issues.   Cardiovascular ROS: no chest pain or dyspnea on exertion positive for - occasional rare sharp chest pain symptoms of palpitations but less frequent.  More related to anxiety. negative for - edema, orthopnea, paroxysmal nocturnal dyspnea, rapid heart rate, shortness of breath, or lightheadedness, dizziness or wooziness, syncope or near syncope and TIA or emesis  ROS:  Review of Systems - Negative except symptoms noted above Most notable issue is anxiety and depression.    Objective   Current Meds  Medication Sig   amlodipine-atorvastatin (CADUET) 10-10 MG tablet Take 1 tablet by mouth at bedtime.   Coenzyme Q10 (COQ10) 100 MG CAPS Take 300 mg by mouth daily.   Omega-3 Fatty Acids (FISH OIL) 1000 MG CPDR Take 3,000 mg by mouth daily.  No longer on Lozol chlorpheniramine. Should be on amlodipine-atorvastatin 10-10 mg daily   Studies Reviewed: SABRA   EKG Interpretation Date/Time:  Tuesday  September 08 2024 13:37:04 EST Ventricular Rate:  88 PR Interval:  138 QRS Duration:  80 QT Interval:  360 QTC Calculation: 435 R Axis:   45  Text Interpretation: Normal sinus rhythm Normal ECG Confirmed by Anner Lenis ([id number]) on 09/08/2024 2:00:25 PM    Results LABS BUN/Creatinine: 24/1.0 (09/07/2024) Glucose: 112 (09/07/2024) Hemoglobin: 15.2 (10/2023) Platelets: 311  (10/2023) Cholesterol: 180 (10/2023) HDL: 27 (10/2023) LDL: 91 (10/2023) Triglycerides: 371 (10/2023) ECHO 04/07/2009: Normal EF greater than 5%.  No RWMA.  Mild bowing of the anterior mitral leaflet without MR.  Mild TR Myoview  Stress Test September 2011: Diaphragmatic attenuation but otherwise normal perfusion.  EF 60%. Exercise capacity-12 METS.  No EKG changes.  Risk Assessment/Calculations:          Physical Exam:   VS:  BP 118/80 (BP Location: Right Arm, Patient Position: Sitting, Cuff Size: Normal)   Pulse 88   Ht 6' (1.829 m)   Wt 190 lb (86.2 kg)   SpO2 97%   BMI 25.77 kg/m    Wt Readings from Last 3 Encounters:  09/08/24 190 lb (86.2 kg)  12/20/20 187 lb (84.8 kg)  09/24/19 191 lb (86.6 kg)      GEN: Well nourished, well developed in no acute distress; in good spirits today.  Healthy-appearing. NECK: No JVD; No carotid bruits CARDIAC: Normal S1, S2; RRR, no murmurs, rubs, gallops RESPIRATORY:  Clear to auscultation without rales, wheezing or rhonchi ; nonlabored, good air movement. ABDOMEN: Soft, non-tender, non-distended EXTREMITIES:  No edema; No deformity      ASSESSMENT AND PLAN: .    Problem List Items Addressed This Visit       Cardiology Problems   Dyslipidemia, goal LDL below 100 (Chronic)   Hypertriglyceridemia and hyperlipidemia in the setting of prediabetes.  Due for follow-up labs Labs from December 2024: Triglycerides at 371 mg/dL, total cholesterol 819 mg/dL, HDL 27 mg/dL, LDL 91 mg/dL.  Glucose levels indicate prediabetes. Elevated triglycerides may be linked to glucose levels. - Recommended dietary modifications: reduce starchy foods and sugary drinks. - He has been taking Caduet which is amlodipine-atorvastatin 10-10 mg daily (need to confirm that he is actually taking) - Advised omega-3 fatty acids (fish oil or krill oil) 3 grams daily. - Suggested CoQ10 up to 300 mg daily. - Monitor triglyceride and glucose levels with upcoming labs.       Relevant Medications   amlodipine-atorvastatin (CADUET) 10-10 MG tablet   Essential hypertension - Primary (Chronic)   Blood pressure well-controlled with current medication. He had been on catheter at (amlodipine-atorvastatin 10 mg daily      Relevant Medications   amlodipine-atorvastatin (CADUET) 10-10 MG tablet   Other Relevant Orders   EKG 12-Lead (Completed)     Other   Anxiety (Chronic)   Anxiety contributing to elevated heart rate and palpitations. Managed with Xanax XR nightly. - Continue Paxil 20 mL daily Xanax 2 mg nightly      Intermittent palpitations (Chronic)   Intermittent palpitations and chest discomfort likely anxiety-related. Resting heart rate 100-105 bpm, possibly due to anxiety. - Advised against frequent heart rate monitoring to reduce anxiety. - Encouraged hydration, especially during physical activity.      Physical deconditioning   Reports low fitness, quick fatigue, occasional chest pain during activity. - Encouraged regular physical activity to improve fitness.              Follow-Up: Return in about 1 year (around 09/08/2025) for 1 Yr Follow-up, [employer].  Signed, Alm MICAEL Clay, MD, MS Alm Clay, M.D., M.S. Interventional Cardiologist  [facility] Pager # [phone]

## 2024-09-13 ENCOUNTER — Encounter: Payer: Self-pay | Admitting: Cardiology

## 2024-09-13 DIAGNOSIS — F419 Anxiety disorder, unspecified: Secondary | ICD-10-CM | POA: Insufficient documentation

## 2024-09-13 DIAGNOSIS — R5381 Other malaise: Secondary | ICD-10-CM | POA: Insufficient documentation

## 2024-09-13 NOTE — Assessment & Plan Note (Signed)
 Anxiety contributing to elevated heart rate and palpitations. Managed with Xanax XR nightly. - Continue Paxil 20 mL daily Xanax 2 mg nightly

## 2024-09-13 NOTE — Assessment & Plan Note (Signed)
 Reports low fitness, quick fatigue, occasional chest pain during activity. - Encouraged regular physical activity to improve fitness.

## 2024-09-13 NOTE — Assessment & Plan Note (Signed)
 Intermittent palpitations and chest discomfort likely anxiety-related. Resting heart rate 100-105 bpm, possibly due to anxiety. - Advised against frequent heart rate monitoring to reduce anxiety. - Encouraged hydration, especially during physical activity.

## 2024-09-13 NOTE — Assessment & Plan Note (Signed)
 Hypertriglyceridemia and hyperlipidemia in the setting of prediabetes.  Due for follow-up labs Labs from December 2024: Triglycerides at 371 mg/dL, total cholesterol 819 mg/dL, HDL 27 mg/dL, LDL 91 mg/dL.  Glucose levels indicate prediabetes. Elevated triglycerides may be linked to glucose levels. - Recommended dietary modifications: reduce starchy foods and sugary drinks. - He has been taking Caduet which is amlodipine-atorvastatin 10-10 mg daily (need to confirm that he is actually taking) - Advised omega-3 fatty acids (fish oil or krill oil) 3 grams daily. - Suggested CoQ10 up to 300 mg daily. - Monitor triglyceride and glucose levels with upcoming labs.

## 2024-09-13 NOTE — Assessment & Plan Note (Signed)
 Blood pressure well-controlled with current medication. He had been on catheter at (amlodipine-atorvastatin 10 mg daily
